# Patient Record
Sex: Female | Born: 1985 | Race: White | Hispanic: No | Smoking: Never smoker
Health system: Southern US, Community
[De-identification: ages and names within clinical notes are randomized; demographics above are authoritative.]

## PROBLEM LIST (undated history)

## (undated) DIAGNOSIS — F32A Depression, unspecified: Secondary | ICD-10-CM

## (undated) DIAGNOSIS — Q7120 Congenital absence of both forearm and hand, unspecified upper limb: Secondary | ICD-10-CM

## (undated) DIAGNOSIS — F329 Major depressive disorder, single episode, unspecified: Secondary | ICD-10-CM

## (undated) DIAGNOSIS — R42 Dizziness and giddiness: Secondary | ICD-10-CM

---

## 2008-07-31 ENCOUNTER — Ambulatory Visit (HOSPITAL_COMMUNITY): Admission: RE | Admit: 2008-07-31 | Discharge: 2008-07-31 | Payer: Self-pay | Admitting: Obstetrics and Gynecology

## 2008-07-31 ENCOUNTER — Encounter (HOSPITAL_COMMUNITY): Payer: Self-pay | Admitting: Obstetrics and Gynecology

## 2010-02-02 ENCOUNTER — Emergency Department: Payer: Self-pay | Admitting: Emergency Medicine

## 2011-04-25 NOTE — Op Note (Signed)
Mary Andersen, SHAHEEN                ACCOUNT NO.:  [id number]   MEDICAL RECORD NO.:  [id number]          PATIENT TYPE:  AMB   LOCATION:  SDC                           FACILITY:  [facility]   PHYSICIAN:  Zelphia Cairo, MD    DATE OF BIRTH:  [birth year]   DATE OF PROCEDURE:  07/31/2008  DATE OF DISCHARGE:                               OPERATIVE REPORT   PREOPERATIVE DIAGNOSIS:  Vertical vaginal septum.   POSTOPERATIVE DIAGNOSIS:  Vertical vaginal septum.   PROCEDURES:  1. Resection of vertical vaginal septum.  2. Examination under anesthesia.  3. Pap smear.   SURGEON:  Zelphia Cairo, MD   ESTIMATED BLOOD LOSS:  Minimal.   COMPLICATIONS:  None.   CONDITION:  Stable to recovery room.   ANESTHESIA:  MAC.   PROCEDURE:  Vasilisa was taken to the operating room where she was placed  in the dorsal lithotomy position using Allen stirrups.  She was prepped  and draped in sterile fashion and a catheter was used to drain her  bladder sterilely for 30 mL of clear urine.  On exam, the vertical  vaginal septum was noted and grasped with an Allis clamp.  A sterile Q-  tip was then placed behind the septum to provide tension.  A Vicryl  suture was used to ligate the superior and inferior portion of the  vaginal septum and the septum was resected using Metzenbaum scissors.  A  speculum exam was then performed using a Pederson speculum.  Cervix  appeared normal and Pap smear was done.  During speculum exam, the hymen  did split somewhat and there was some mild oozing and interrupted  sutures of 3-0 Vicryl were placed to achieve hemostasis.  Alix  tolerated the procedure well.  She was taken to the recovery room in  stable condition.  Sponge lap, needle, and instrument counts were  correct x2.      Zelphia Cairo, MD  Electronically Signed     GA/MEDQ  D:  07/31/2008  T:  08/01/2008  Job:  [id number]

## 2012-04-29 ENCOUNTER — Ambulatory Visit (INDEPENDENT_AMBULATORY_CARE_PROVIDER_SITE_OTHER): Payer: BC Managed Care – PPO | Admitting: Internal Medicine

## 2012-04-29 VITALS — BP 126/80 | HR 74 | Temp 98.4°F | Resp 16 | Ht 61.0 in | Wt 131.2 lb

## 2012-04-29 DIAGNOSIS — Q7120 Congenital absence of both forearm and hand, unspecified upper limb: Secondary | ICD-10-CM | POA: Insufficient documentation

## 2012-04-29 DIAGNOSIS — Z0000 Encounter for general adult medical examination without abnormal findings: Secondary | ICD-10-CM

## 2012-04-29 DIAGNOSIS — R42 Dizziness and giddiness: Secondary | ICD-10-CM

## 2012-04-29 DIAGNOSIS — Q71899 Other reduction defects of unspecified upper limb: Secondary | ICD-10-CM

## 2012-04-29 DIAGNOSIS — R1110 Vomiting, unspecified: Secondary | ICD-10-CM

## 2012-04-29 LAB — POCT UA - MICROSCOPIC ONLY
Casts, Ur, LPF, POC: NEGATIVE
Crystals, Ur, HPF, POC: NEGATIVE
Yeast, UA: NEGATIVE

## 2012-04-29 LAB — POCT URINALYSIS DIPSTICK
Bilirubin, UA: NEGATIVE
Glucose, UA: NEGATIVE
Ketones, UA: NEGATIVE
Leukocytes, UA: NEGATIVE
Nitrite, UA: NEGATIVE
Spec Grav, UA: 1.025
Urobilinogen, UA: 0.2
pH, UA: 7

## 2012-04-29 LAB — POCT CBC
Granulocyte percent: 57.5 %G (ref 37–80)
HCT, POC: 35.4 % — AB (ref 37.7–47.9)
Hemoglobin: 11.4 g/dL — AB (ref 12.2–16.2)
Lymph, poc: 1.7 (ref 0.6–3.4)
MCH, POC: 28.4 pg (ref 27–31.2)
MCHC: 32.2 g/dL (ref 31.8–35.4)
MCV: 88.3 fL (ref 80–97)
MID (cbc): 0.4 (ref 0–0.9)
MPV: 8.8 fL (ref 0–99.8)
POC Granulocyte: 2.8 (ref 2–6.9)
POC LYMPH PERCENT: 35.2 %L (ref 10–50)
POC MID %: 7.3 %M (ref 0–12)
Platelet Count, POC: 217 10*3/uL (ref 142–424)
RBC: 4.01 M/uL — AB (ref 4.04–5.48)
RDW, POC: 12.3 %
WBC: 4.8 10*3/uL (ref 4.6–10.2)

## 2012-04-29 MED ORDER — ONDANSETRON 4 MG PO TBDP: 4.0000 mg | ORAL_TABLET | Freq: Three times a day (TID) | ORAL | Status: AC | PRN

## 2012-04-29 NOTE — Progress Notes (Signed)
Subjective:    Patient ID: Mary Andersen, female    DOB: [birth year], 26 y.o.   MRN: [id number]  HPI Here for physical exam. Had another episode of vertigo last week but does not have it now. Unable to take the bonine because of nausea and vomiting.she has her gyn exam pap smear breast exam done yearly by her gyn. She is up to date on her immunizations.   Review of Systems  Constitutional: Negative.   HENT: Negative.   Eyes: Negative.   Respiratory: Negative.   Cardiovascular: Negative.   Gastrointestinal: Negative.   Genitourinary: Negative.   Musculoskeletal: Negative.   Skin: Negative.   Neurological: Positive for dizziness.  Hematological: Negative.   Psychiatric/Behavioral: Negative.   All other systems reviewed and are negative.       Objective:   Physical Exam  Nursing note and vitals reviewed. Constitutional: She is oriented to person, place, and time. She appears well-developed and well-nourished.  HENT:  Head: Normocephalic and atraumatic.  Right Ear: External ear normal.  Left Ear: External ear normal.  Nose: Nose normal.  Mouth/Throat: Oropharynx is clear and moist.  Eyes: Conjunctivae and EOM are normal. Pupils are equal, round, and reactive to light.  Neck: Normal range of motion. Neck supple. No thyromegaly present.  Cardiovascular: Normal rate, regular rhythm, normal heart sounds and intact distal pulses.   Pulmonary/Chest: Effort normal and breath sounds normal.  Abdominal: Soft. Bowel sounds are normal.  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 She has normal reflexes.  Skin: Skin is warm and dry.  Psychiatric: She has a normal mood and affect. Her behavior is normal. Judgment and thought content normal.     Results for orders placed in visit on 04/29/12  POCT UA - MICROSCOPIC ONLY      Component Value Range   WBC, Ur, HPF, POC 2-4       RBC, urine, microscopic 30-40     Bacteria, U Microscopic 2+     Mucus, UA small     Epithelial cells, urine per micros 2-4     Crystals, Ur, HPF, POC neg     Casts, Ur, LPF, POC neg     Yeast, UA neg    POCT URINALYSIS DIPSTICK      Component Value Range   Color, UA yellow     Clarity, UA cloudy     Glucose, UA neg     Bilirubin, UA neg     Ketones, UA neg     Spec Grav, UA 1.025     Blood, UA large     pH, UA 7.0     Protein, UA trace     Urobilinogen, UA 0.2     Nitrite, UA neg     Leukocytes, UA Negative    POCT CBC      Component Value Range   WBC 4.8  4.6 - 10.2 (K/uL)   Lymph, poc 1.7  0.6 - 3.4    POC LYMPH PERCENT 35.2  10 - 50 (%L)   MID (cbc) 0.4  0 - 0.9    POC MID % 7.3  0 - 12 (%M)   POC Granulocyte 2.8  2 - 6.9    Granulocyte percent 57.5  37 - 80 (%G)   RBC 4.01 (*) 4.04 - 5.48 (M/uL)   Hemoglobin 11.4 (*) 12.2 - 16.2 (  g/dL)   HCT, POC 11.9 (*) 14.7 - 47.9 (%)   MCV 88.3  80 - 97 (fL)   MCH, POC 28.4  27 - 31.2 (pg)   MCHC 32.2  31.8 - 35.4 (g/dL)   RDW, POC 82.9     Platelet Count, POC 217  142 - 424 (K/uL)   MPV 8.8  0 - 99.8 (fL)       Assessment & Plan:  Annual exam amd eval for vertigo with antiemetic rx Mild anemia.Menstruating at present.  Will recommend mvi with iron.

## 2012-04-29 NOTE — Patient Instructions (Signed)
Take a multivitamin with iron daily. The rest of your tests will be back in one week and we will contact you with the results.

## 2012-04-30 LAB — VITAMIN D 25 HYDROXY (VIT D DEFICIENCY, FRACTURES): Vit D, 25-Hydroxy: 39 ng/mL (ref 30–89)

## 2012-04-30 LAB — COMPREHENSIVE METABOLIC PANEL
ALT: 14 U/L (ref 0–35)
AST: 19 U/L (ref 0–37)
Albumin: 4.5 g/dL (ref 3.5–5.2)
Alkaline Phosphatase: 49 U/L (ref 39–117)
BUN: 14 mg/dL (ref 6–23)
CO2: 27 mEq/L (ref 19–32)
Calcium: 9.6 mg/dL (ref 8.4–10.5)
Chloride: 105 mEq/L (ref 96–112)
Creat: 0.57 mg/dL (ref 0.50–1.10)
Glucose, Bld: 86 mg/dL (ref 70–99)
Potassium: 3.9 mEq/L (ref 3.5–5.3)
Sodium: 135 mEq/L (ref 135–145)
Total Bilirubin: 0.6 mg/dL (ref 0.3–1.2)
Total Protein: 7.2 g/dL (ref 6.0–8.3)

## 2012-04-30 LAB — TSH: TSH: 1.579 u[IU]/mL (ref 0.350–4.500)

## 2012-04-30 LAB — LIPID PANEL
Cholesterol: 170 mg/dL (ref 0–200)
HDL: 49 mg/dL (ref >39)
LDL Cholesterol: 110 mg/dL — ABNORMAL HIGH (ref 0–99)
Total CHOL/HDL Ratio: 3.5 Ratio
Triglycerides: 53 mg/dL (ref <150)
VLDL: 11 mg/dL (ref 0–40)

## 2012-08-15 ENCOUNTER — Ambulatory Visit: Payer: Self-pay | Admitting: Otolaryngology

## 2012-08-15 LAB — CBC WITH DIFFERENTIAL/PLATELET
Basophil #: 0 10*3/uL (ref 0.0–0.1)
Basophil %: 0.2 %
Eosinophil #: 0 10*3/uL (ref 0.0–0.7)
Eosinophil %: 0 %
HCT: 40.2 % (ref 35.0–47.0)
HGB: 13.4 g/dL (ref 12.0–16.0)
Lymphocyte #: 0.6 10*3/uL — ABNORMAL LOW (ref 1.0–3.6)
Lymphocyte %: 4.7 %
MCH: 29.5 pg (ref 26.0–34.0)
MCHC: 33.3 g/dL (ref 32.0–36.0)
MCV: 89 fL (ref 80–100)
Monocyte #: 0.4 x10 3/mm (ref 0.2–0.9)
Monocyte %: 3 %
Neutrophil #: 11.3 10*3/uL — ABNORMAL HIGH (ref 1.4–6.5)
Neutrophil %: 92.1 %
Platelet: 242 10*3/uL (ref 150–440)
RBC: 4.53 10*6/uL (ref 3.80–5.20)
RDW: 12 % (ref 11.5–14.5)
WBC: 12.3 10*3/uL — ABNORMAL HIGH (ref 3.6–11.0)

## 2012-08-15 LAB — MONONUCLEOSIS SCREEN: Mono Test: POSITIVE

## 2013-02-25 ENCOUNTER — Ambulatory Visit: Payer: Self-pay | Admitting: Family Medicine

## 2013-05-16 ENCOUNTER — Ambulatory Visit: Payer: Self-pay | Admitting: Family Medicine

## 2013-05-19 ENCOUNTER — Ambulatory Visit: Payer: Self-pay | Admitting: Family Medicine

## 2013-05-27 ENCOUNTER — Ambulatory Visit (INDEPENDENT_AMBULATORY_CARE_PROVIDER_SITE_OTHER): Payer: BC Managed Care – PPO | Admitting: Family Medicine

## 2013-05-27 ENCOUNTER — Encounter: Payer: Self-pay | Admitting: Family Medicine

## 2013-05-27 VITALS — BP 120/66 | HR 72 | Temp 98.1°F | Ht 60.75 in | Wt 140.0 lb

## 2013-05-27 DIAGNOSIS — Z0000 Encounter for general adult medical examination without abnormal findings: Secondary | ICD-10-CM

## 2013-05-27 DIAGNOSIS — Z136 Encounter for screening for cardiovascular disorders: Secondary | ICD-10-CM

## 2013-05-27 LAB — COMPREHENSIVE METABOLIC PANEL
ALT: 16 U/L (ref 0–35)
AST: 17 U/L (ref 0–37)
Albumin: 3.8 g/dL (ref 3.5–5.2)
Alkaline Phosphatase: 49 U/L (ref 39–117)
BUN: 8 mg/dL (ref 6–23)
CO2: 23 mEq/L (ref 19–32)
Calcium: 9.9 mg/dL (ref 8.4–10.5)
Chloride: 106 mEq/L (ref 96–112)
Creatinine, Ser: 0.5 mg/dL (ref 0.4–1.2)
GFR: 152.44 mL/min (ref >60.00)
Glucose, Bld: 98 mg/dL (ref 70–99)
Potassium: 3.5 mEq/L (ref 3.5–5.1)
Sodium: 140 mEq/L (ref 135–145)
Total Bilirubin: 0.5 mg/dL (ref 0.3–1.2)
Total Protein: 7.4 g/dL (ref 6.0–8.3)

## 2013-05-27 LAB — CBC WITH DIFFERENTIAL/PLATELET
Basophils Absolute: 0 10*3/uL (ref 0.0–0.1)
Basophils Relative: 0.4 % (ref 0.0–3.0)
Eosinophils Absolute: 0 10*3/uL (ref 0.0–0.7)
Eosinophils Relative: 0.6 % (ref 0.0–5.0)
HCT: 36.3 % (ref 36.0–46.0)
Hemoglobin: 12.4 g/dL (ref 12.0–15.0)
Lymphocytes Relative: 21.3 % (ref 12.0–46.0)
Lymphs Abs: 1.6 10*3/uL (ref 0.7–4.0)
MCHC: 34.1 g/dL (ref 30.0–36.0)
MCV: 88.3 fl (ref 78.0–100.0)
Monocytes Absolute: 0.6 10*3/uL (ref 0.1–1.0)
Monocytes Relative: 8.5 % (ref 3.0–12.0)
Neutro Abs: 5.2 10*3/uL (ref 1.4–7.7)
Neutrophils Relative %: 69.2 % (ref 43.0–77.0)
Platelets: 239 10*3/uL (ref 150.0–400.0)
RBC: 4.11 Mil/uL (ref 3.87–5.11)
RDW: 12.4 % (ref 11.5–14.6)
WBC: 7.5 10*3/uL (ref 4.5–10.5)

## 2013-05-27 LAB — LIPID PANEL
Cholesterol: 195 mg/dL (ref 0–200)
HDL: 55 mg/dL (ref >39.00)
LDL Cholesterol: 125 mg/dL — ABNORMAL HIGH (ref 0–99)
Total CHOL/HDL Ratio: 4
Triglycerides: 75 mg/dL (ref 0.0–149.0)
VLDL: 15 mg/dL (ref 0.0–40.0)

## 2013-05-27 NOTE — Progress Notes (Signed)
  Subjective:    Patient ID: Mary Andersen, female    DOB: [birth year], 27 y.o.   MRN: [id number]  HPI 27 yo female who is [redacted] weeks pregnant here to establish care without complaints.   She has been feeling very well.  Established with Dr. Renaldo Fiddler, OBGYN.  Taking PNV daily.  Has had some fatigue and constipation, otherwise feels great.  She is adopted, unsure of family history.  Patient Active Problem List   Diagnosis Date Noted  . Routine general medical examination at a health care facility 05/27/2013   No past medical history on file. No past surgical history on file. History  Substance Use Topics  . Smoking status: Never Smoker   . Smokeless tobacco: Not on file  . Alcohol Use: Not on file   Family History  Problem Relation Age of Onset  . Adopted: Yes   No Known Allergies No current outpatient prescriptions on file prior to visit.   No current facility-administered medications on file prior to visit.   The PMH, PSH, Social History, Family History, Medications, and allergies have been reviewed in [facility], and have been updated if relevant.    Review of Systems See HPI    Objective:   Physical Exam BP 120/66  Pulse 72  Temp(Src) 98.1 F (36.7 C)  Ht 5' 0.75" (1.543 m)  Wt 140 lb (63.504 kg)  BMI 26.67 kg/m2  General:  Well-developed,well-nourished,in no acute distress; alert,appropriate and cooperative throughout examination Head:  normocephalic and atraumatic.   Eyes:  vision grossly intact, pupils equal, pupils round, and pupils reactive to light.   Ears:  R ear normal and L ear normal.   Nose:  no external deformity.   Mouth:  good dentition.   Neck:  No deformities, masses, or tenderness noted. Breasts:  No mass, nodules, thickening, tenderness, bulging, retraction, inflamation, nipple discharge or skin changes noted.   Lungs:  Normal respiratory effort, chest expands symmetrically. Lungs are clear to auscultation, no crackles or wheezes. Heart:  Normal  rate and regular rhythm. S1 and S2 normal without gallop, murmur, click, rub or other extra sounds. Abdomen:  Bowel sounds positive,abdomen soft and non-tender without masses, organomegaly or hernias noted. Msk:  No deformity or scoliosis noted of thoracic or lumbar spine.   Extremities:   Left arm congential defect Neurologic:  alert & oriented X3 and gait normal.   Skin:  Intact without suspicious lesions or rashes Cervical Nodes:  No lymphadenopathy noted Axillary Nodes:  No palpable lymphadenopathy Psych:  Cognition and judgment appear intact. Alert and cooperative with normal attention span and concentration. No apparent delusions, illusions, hallucinations    Assessment & Plan:  1. Routine general medical examination at a health care facility Reviewed preventive care protocols, scheduled due services, and updated immunizations Discussed nutrition, exercise, diet, and healthy lifestyle.  - Comprehensive metabolic panel - CBC with Differential  2. Screening for ischemic heart disease  - Lipid Panel

## 2013-05-27 NOTE — Patient Instructions (Addendum)
It was so nice to meet you. We will call you with your lab results.  Good luck with your pregnancy!

## 2013-05-28 ENCOUNTER — Encounter: Payer: Self-pay | Admitting: *Deleted

## 2013-05-29 LAB — OB RESULTS CONSOLE GC/CHLAMYDIA
Chlamydia: NEGATIVE
Gonorrhea: NEGATIVE

## 2013-05-29 LAB — OB RESULTS CONSOLE ABO/RH: RH Type: POSITIVE

## 2013-05-29 LAB — OB RESULTS CONSOLE HEPATITIS B SURFACE ANTIGEN: Hepatitis B Surface Ag: NEGATIVE

## 2013-05-29 LAB — OB RESULTS CONSOLE ANTIBODY SCREEN: Antibody Screen: NEGATIVE

## 2013-05-29 LAB — OB RESULTS CONSOLE RUBELLA ANTIBODY, IGM: Rubella: IMMUNE

## 2013-05-29 LAB — OB RESULTS CONSOLE RPR: RPR: NONREACTIVE

## 2013-05-29 LAB — OB RESULTS CONSOLE HIV ANTIBODY (ROUTINE TESTING): HIV: NONREACTIVE

## 2013-11-27 ENCOUNTER — Encounter (HOSPITAL_COMMUNITY): Payer: Self-pay | Admitting: Emergency Medicine

## 2013-11-27 ENCOUNTER — Emergency Department (HOSPITAL_COMMUNITY)
Admission: EM | Admit: 2013-11-27 | Discharge: 2013-11-27 | Disposition: A | Discharge status: Home / Self Care | Payer: BC Managed Care – PPO | Attending: Emergency Medicine | Admitting: Emergency Medicine

## 2013-11-27 DIAGNOSIS — R51 Headache: Secondary | ICD-10-CM | POA: Insufficient documentation

## 2013-11-27 DIAGNOSIS — R519 Headache, unspecified: Secondary | ICD-10-CM

## 2013-11-27 DIAGNOSIS — R82998 Other abnormal findings in urine: Secondary | ICD-10-CM | POA: Insufficient documentation

## 2013-11-27 DIAGNOSIS — Z8669 Personal history of other diseases of the nervous system and sense organs: Secondary | ICD-10-CM | POA: Insufficient documentation

## 2013-11-27 DIAGNOSIS — O9989 Other specified diseases and conditions complicating pregnancy, childbirth and the puerperium: Secondary | ICD-10-CM | POA: Insufficient documentation

## 2013-11-27 DIAGNOSIS — R109 Unspecified abdominal pain: Secondary | ICD-10-CM | POA: Insufficient documentation

## 2013-11-27 DIAGNOSIS — Z79899 Other long term (current) drug therapy: Secondary | ICD-10-CM | POA: Insufficient documentation

## 2013-11-27 DIAGNOSIS — R5381 Other malaise: Secondary | ICD-10-CM | POA: Insufficient documentation

## 2013-11-27 DIAGNOSIS — Q71899 Other reduction defects of unspecified upper limb: Secondary | ICD-10-CM | POA: Insufficient documentation

## 2013-11-27 DIAGNOSIS — N390 Urinary tract infection, site not specified: Secondary | ICD-10-CM

## 2013-11-27 HISTORY — DX: Dizziness and giddiness: R42

## 2013-11-27 HISTORY — DX: Congenital absence of both forearm and hand, unspecified upper limb: Q71.20

## 2013-11-27 LAB — COMPREHENSIVE METABOLIC PANEL
ALT: 11 U/L (ref 0–35)
AST: 17 U/L (ref 0–37)
Albumin: 3.1 g/dL — ABNORMAL LOW (ref 3.5–5.2)
Alkaline Phosphatase: 92 U/L (ref 39–117)
BUN: 9 mg/dL (ref 6–23)
CO2: 21 mEq/L (ref 19–32)
Calcium: 8.7 mg/dL (ref 8.4–10.5)
Chloride: 105 mEq/L (ref 96–112)
Creatinine, Ser: 0.4 mg/dL — ABNORMAL LOW (ref 0.50–1.10)
GFR calc Af Amer: >90 mL/min (ref >90)
GFR calc non Af Amer: >90 mL/min (ref >90)
Glucose, Bld: 78 mg/dL (ref 70–99)
Potassium: 3.6 mEq/L (ref 3.5–5.1)
Sodium: 137 mEq/L (ref 135–145)
Total Bilirubin: 0.2 mg/dL — ABNORMAL LOW (ref 0.3–1.2)
Total Protein: 6.9 g/dL (ref 6.0–8.3)

## 2013-11-27 LAB — CBC
HCT: 33.2 % — ABNORMAL LOW (ref 36.0–46.0)
Hemoglobin: 11.8 g/dL — ABNORMAL LOW (ref 12.0–15.0)
MCH: 30.2 pg (ref 26.0–34.0)
MCHC: 35.5 g/dL (ref 30.0–36.0)
MCV: 84.9 fL (ref 78.0–100.0)
Platelets: 198 10*3/uL (ref 150–400)
RBC: 3.91 MIL/uL (ref 3.87–5.11)
RDW: 13 % (ref 11.5–15.5)
WBC: 9.4 10*3/uL (ref 4.0–10.5)

## 2013-11-27 LAB — URINALYSIS, ROUTINE W REFLEX MICROSCOPIC
Bilirubin Urine: NEGATIVE
Glucose, UA: NEGATIVE mg/dL
Hgb urine dipstick: NEGATIVE
Ketones, ur: NEGATIVE mg/dL
Nitrite: NEGATIVE
Protein, ur: NEGATIVE mg/dL
Specific Gravity, Urine: 1.019 (ref 1.005–1.030)
Urobilinogen, UA: 1 mg/dL (ref 0.0–1.0)
pH: 7 (ref 5.0–8.0)

## 2013-11-27 LAB — URINE MICROSCOPIC-ADD ON

## 2013-11-27 MED ORDER — NITROFURANTOIN MONOHYD MACRO 100 MG PO CAPS: 100.0000 mg | ORAL_CAPSULE | Freq: Two times a day (BID) | ORAL | Status: DC

## 2013-11-27 NOTE — ED Notes (Signed)
Rapid OB nurse called 

## 2013-11-27 NOTE — Progress Notes (Signed)
[facility] ED called regarding pt at 34 weeks for headache, blurry vision, and weakness. OB RR RN in route

## 2013-11-27 NOTE — ED Notes (Signed)
Dr. Walton at the bedside  

## 2013-11-27 NOTE — Consult Note (Signed)
NEURO HOSPITALIST CONSULT NOTE    Reason for Consult: HA, visual disturbances, legs heaviness, confusion.  HPI:                                                                                                                                          Mary Andersen is an 27 y.o. female with a past medical history significant for episodic vertigo, [redacted] weeks pregnant, sent over to the ED by her OB ( Dr. Renaldo Fiddler) for further evaluation of the above stated symptoms. She said that this is her first pregnancy and it has been uncomplicated so far. Mary Andersen indicated that for the past 3 days she has been experiencing daily, recurrent, intermittent episodes of blurred vision with " starts and flashing silver and brown lights that last for 30-60 minutes and then fade away", associated with a mild, pounding frontal and back of the left head pain without nausea, vomiting, photophobia, or phonophobia. She reports that she is " kind of confused and having trouble putting my thoughts together throughout the episodes". In addition, her legs feel heavy but there is not weakness of her arms, imbalance, or language impairment. According to patient, the episodes are something entirely new to her as she never gets HA.  Denies recent head/neck trauma, fever, infection, or skin rash. Of importance, she and her mother report significant stress in life due to recent death in the family.  Past Medical History  Diagnosis Date  . Vertigo   . 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 No pertinent past surgical history.    Social History:  reports that she has never smoked. She does not have any smokeless tobacco history on file. She reports that she does not drink alcohol or use illicit drugs.  No Known Allergies  MEDICATIONS:                                                                                                                     I have reviewed the  patient's current medications.   ROS:  History obtained from the patient and chart review.  General ROS: negative for - chills, fatigue, fever, night sweats, weight gain or weight loss Psychological ROS: negative for - behavioral disorder, hallucinations, memory difficulties, mood swings or suicidal ideation Ophthalmic ROS: negative for - blurry vision, double vision, eye pain or loss of vision ENT ROS: negative for - epistaxis, nasal discharge, oral lesions, sore throat, tinnitus or vertigo Allergy and Immunology ROS: negative for - hives or itchy/watery eyes Hematological and Lymphatic ROS: negative for - bleeding problems, bruising or swollen lymph nodes Endocrine ROS: negative for - galactorrhea, hair pattern changes, polydipsia/polyuria or temperature intolerance Respiratory ROS: negative for - cough, hemoptysis, shortness of breath or wheezing Cardiovascular ROS: negative for - chest pain, dyspnea on exertion, edema or irregular heartbeat Gastrointestinal ROS: negative for - abdominal pain, diarrhea, hematemesis, nausea/vomiting or stool incontinence Genito-Urinary ROS: negative for - dysuria, hematuria, incontinence or urinary frequency/urgency Musculoskeletal ROS: negative for - joint swelling or muscular weakness Neurological ROS: as noted in HPI Dermatological ROS: negative for rash and skin lesion changes   Physical exam: pleasant female in no apparent distress. Blood pressure 106/58, pulse 66, resp. rate 20, SpO2 100.00%. Head: normocephalic. Neck: supple, no bruits, no JVD. Cardiac: no murmurs. Lungs: clear. Abdomen: soft, no tender, no mass. Extremities: no edema. CV: pulses palpable throughout  Neurologic Examination:                                                                                                      Mental Status: Alert,  oriented, thought content appropriate.  Speech fluent without evidence of aphasia.  Able to follow 3 step commands without difficulty. Cranial Nerves: II: Discs flat bilaterally; Visual fields grossly normal, pupils equal, round, reactive to light and accommodation III,IV, VI: ptosis not present, extra-ocular motions intact bilaterally V,VII: smile symmetric, facial light touch sensation normal bilaterally VIII: hearing normal bilaterally IX,X: gag reflex present XI: bilateral shoulder shrug XII: midline tongue extension without atrophy or fasciculations  Motor: Right : Upper extremity   5/5    Left:     Upper extremity   5/5  Lower extremity   5/5     Lower extremity   5/5 Tone and bulk:normal tone throughout; no atrophy noted Sensory: Pinprick and light touch intact throughout, bilaterally Deep Tendon Reflexes:  Right: Upper Extremity   Left: Upper extremity   biceps (C-5 to C-6) 2/4   biceps (C-5 to C-6) 2/4 tricep (C7) 2/4    triceps (C7) 2/4 Brachioradialis (C6) 2/4  Brachioradialis (C6) 2/4  Lower Extremity Lower Extremity  quadriceps (L-2 to L-4) 2/4   quadriceps (L-2 to L-4) 2/4 Achilles (S1) 2/4   Achilles (S1) 2/4  Plantars: Right: downgoing   Left: downgoing Cerebellar: normal finger-to-nose and  normal heel-to-shin test. Gait:  No tested.    Lab Results  Component Value Date/Time   CHOL 195 05/27/2013 11:16 AM    Results for orders placed during the hospital encounter of 11/27/13 (from the past 48 hour(s))  CBC     Status: Abnormal   Collection Time  11/27/13  5:23 PM      Result Value Range   WBC 9.4  4.0 - 10.5 K/uL   RBC 3.91  3.87 - 5.11 MIL/uL   Hemoglobin 11.8 (*) 12.0 - 15.0 g/dL   HCT 81.1 (*) 91.4 - 78.2 %   MCV 84.9  78.0 - 100.0 fL   MCH 30.2  26.0 - 34.0 pg   MCHC 35.5  30.0 - 36.0 g/dL   RDW 95.6  21.3 - 08.6 %   Platelets 198  150 - 400 K/uL  COMPREHENSIVE METABOLIC PANEL     Status: Abnormal   Collection Time    11/27/13  5:23 PM       Result Value Range   Sodium 137  135 - 145 mEq/L   Potassium 3.6  3.5 - 5.1 mEq/L   Chloride 105  96 - 112 mEq/L   CO2 21  19 - 32 mEq/L   Glucose, Bld 78  70 - 99 mg/dL   BUN 9  6 - 23 mg/dL   Creatinine, Ser 5.78 (*) 0.50 - 1.10 mg/dL   Calcium 8.7  8.4 - 46.9 mg/dL   Total Protein 6.9  6.0 - 8.3 g/dL   Albumin 3.1 (*) 3.5 - 5.2 g/dL   AST 17  0 - 37 U/L   ALT 11  0 - 35 U/L   Alkaline Phosphatase 92  39 - 117 U/L   Total Bilirubin 0.2 (*) 0.3 - 1.2 mg/dL   GFR calc non Af Amer >90  >90 mL/min   GFR calc Af Amer >90  >90 mL/min   Comment: (NOTE)     The eGFR has been calculated using the CKD EPI equation.     This calculation has not been validated in all clinical situations.     eGFR's persistently <90 mL/min signify possible Chronic Kidney     Disease.  URINALYSIS, ROUTINE W REFLEX MICROSCOPIC     Status: Abnormal   Collection Time    11/27/13  6:27 PM      Result Value Range   Color, Urine YELLOW  YELLOW   APPearance CLEAR  CLEAR   Specific Gravity, Urine 1.019  1.005 - 1.030   pH 7.0  5.0 - 8.0   Glucose, UA NEGATIVE  NEGATIVE mg/dL   Hgb urine dipstick NEGATIVE  NEGATIVE   Bilirubin Urine NEGATIVE  NEGATIVE   Ketones, ur NEGATIVE  NEGATIVE mg/dL   Protein, ur NEGATIVE  NEGATIVE mg/dL   Urobilinogen, UA 1.0  0.0 - 1.0 mg/dL   Nitrite NEGATIVE  NEGATIVE   Leukocytes, UA MODERATE (*) NEGATIVE  URINE MICROSCOPIC-ADD ON     Status: Abnormal   Collection Time    11/27/13  6:27 PM      Result Value Range   Squamous Epithelial / LPF FEW (*) RARE   WBC, UA 0-2  <3 WBC/hpf   RBC / HPF 0-2  <3 RBC/hpf   Bacteria, UA FEW (*) RARE   Urine-Other MUCOUS PRESENT      No results found.  Assessment/Plan: 27 y/o who is currently [redacted] weeks pregnant and complains of new onset daily, intermittent mild HA with visual disturbances, legs heaviness, and confusion. She has a non focal neurological examination and stressed the fact that in between those transient episodes she feels  perfectly well. At this moment she is asymptomatic from a neurological standpoint and the pattern of her clinical picture is not quite typical for the usual causes of HA with visual  disturbances during pregnancy: stroke ( ICH, ischemic stroke, or CVT) reversible vasoconstriction syndrome, PRES. Unclear if this could be a first presentation of pregnancy related migraine with aura. She and her family are very concern about radiation exposure during pregnancy and her syndrome is not quite compelling to me to embark on pursuing MRA/MRA/MRV brain at this time. Therefore, we agreed on deferring neuro-imaging at this moment, but she was firmly advised to seek immediate medical attention in the ED in case of worsening of her symptoms or any new neurological development.       Wyatt Portela, MD 11/27/2013, 7:46 PM

## 2013-11-27 NOTE — ED Notes (Signed)
Dr. goldston at the bedside 

## 2013-11-27 NOTE — Progress Notes (Signed)
Dr. Lawrence Marseilles notified of Dr. Vance Gather request for a neurological workup.

## 2013-11-27 NOTE — ED Notes (Addendum)
Per pt sts since a few days ago she has been having HA, floaters, seeing star burst. sts some extreme weakness. Pt is [redacted] weeks pregnant. sts was seen by her OBGYN and was told to come here for neuro workup. Pt also complaining of pelvic pressure

## 2013-11-27 NOTE — Progress Notes (Addendum)
At pt bedside. EFM being placed. Pt is at 24 weeks and was sent over by OB (Dr. Renaldo Fiddler) for headache for 48 hours with blurry and spotting vision off and on. Pt states she has had an increase in "memory loss and extreme weakness." Pt has a history of severe vertigo with headaches and nausea/vomiting. Pt stated that OB sent pt to [facility] ED for neurological workup.

## 2013-11-27 NOTE — ED Provider Notes (Signed)
CSN: [id number]     Arrival date & time 11/27/13  1652 History   First MD Initiated Contact with Patient 11/27/13 1706     Chief Complaint  Patient presents with  . Headache  . Weakness   (Consider location/radiation/quality/duration/timing/severity/associated sxs/prior Treatment) Patient is a 27 y.o. female presenting with headaches.  Headache Pain location:  Generalized Quality:  Dull Severity currently:  0/10 Onset quality:  Gradual Timing:  Intermittent Progression:  Worsening Chronicity:  Recurrent Context: bright light   Relieved by:  Nothing Associated symptoms: abdominal pain (mild abd pain 2/2 pregnancy, nothing new) and visual change   Associated symptoms: no back pain, no cough, no diarrhea, no pain, no fever, no nausea, no numbness, no sore throat and no vomiting    Mary Andersen is a 27 year old female Past Medical History  Diagnosis Date  . Vertigo   . 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 No pertinent past surgical history. Family History  Problem Relation Age of Onset  . Adopted: Yes   History  Substance Use Topics  . Smoking status: Never Smoker   . Smokeless tobacco: Not on file  . Alcohol Use: No   OB History   Grav Para Term Preterm Abortions TAB SAB Ect Mult Living   1              Review of Systems  Constitutional: Negative for fever and chills.  HENT: Negative for sore throat.   Eyes: Negative for pain.  Respiratory: Negative for cough and shortness of breath.   Cardiovascular: Negative for chest pain.  Gastrointestinal: Positive for abdominal pain (mild abd pain 2/2 pregnancy, nothing new). Negative for nausea, vomiting and diarrhea.  Genitourinary: Negative for dysuria.  Musculoskeletal: Negative for back pain.  Skin: Negative for rash.  Neurological: Positive for headaches. Negative for numbness.    Allergies  Review of patient's allergies indicates no known allergies.  Home Medications    Current Outpatient Rx  Name  Route  Sig  Dispense  Refill  . acetaminophen (TYLENOL) 325 MG tablet   Oral   Take 650 mg by mouth daily as needed for mild pain.         . Prenatal Vit-Fe Fumarate-FA (MULTIVITAMIN-PRENATAL) 27-0.8 MG TABS tablet   Oral   Take 1 tablet by mouth daily at 12 noon.         . nitrofurantoin, macrocrystal-monohydrate, (MACROBID) 100 MG capsule   Oral   Take 1 capsule (100 mg total) by mouth 2 (two) times daily. X 7 days   14 capsule   0    BP 119/69  Pulse 70  Resp 16  SpO2 98% Physical Exam  Constitutional: She is oriented to person, place, and time. She appears well-developed and well-nourished. No distress.  HENT:  Head: Normocephalic and atraumatic.  Eyes: Pupils are equal, round, and reactive to light. Right eye exhibits no discharge. Left eye exhibits no discharge.  Neck: Normal range of motion.  Cardiovascular: Normal rate, regular rhythm and normal heart sounds.   Pulmonary/Chest: Effort normal and breath sounds normal.  Abdominal: Soft. She exhibits distension (gravid). There is no tenderness.  Musculoskeletal: Normal range of motion.  Neurological: She is alert and oriented to person, place, and time. She has normal strength. No cranial nerve deficit or sensory deficit. She displays a negative Romberg sign. Coordination and gait normal. GCS eye subscore is 4. GCS verbal subscore is 5. GCS motor subscore is 6.  Skin: Skin is  warm. She is not diaphoretic.    ED Course  Procedures (including critical care time) Labs Review Labs Reviewed  CBC - Abnormal; Notable for the following:    Hemoglobin 11.8 (*)    HCT 33.2 (*)    All other components within normal limits  COMPREHENSIVE METABOLIC PANEL - Abnormal; Notable for the following:    Creatinine, Ser 0.40 (*)    Albumin 3.1 (*)    Total Bilirubin 0.2 (*)    All other components within normal limits  URINALYSIS, ROUTINE W REFLEX MICROSCOPIC - Abnormal; Notable for the following:     Leukocytes, UA MODERATE (*)    All other components within normal limits  URINE MICROSCOPIC-ADD ON - Abnormal; Notable for the following:    Squamous Epithelial / LPF FEW (*)    Bacteria, UA FEW (*)    All other components within normal limits  URINE CULTURE   Imaging Review No results found.  EKG Interpretation   None       MDM   1. Headache   2. Bacteriuria, asymptomatic, in pregnancy, third trimester    27 yo F, pregnant at [redacted] weeks, presents with headaches, visual changes. OB concerned that it is unrelated to preeclampsia, recommended neuro follow-up.   Will consult neuro, follow-up basic labs. Please see neuro consult for their full evaluation/recommendations. Patient with normal neuro exam, no acute findings on exam. Mild bacteriuria, will treat with macrobid. Discussed follow-up with patient, who agrees to continue to monitor symptoms, will follow-up as needed. Discharged to home in stable condition. Strict return precautions given.     Imagene Sheller, MD 11/28/13 0100

## 2013-11-27 NOTE — Progress Notes (Signed)
Dr. Rana Snare called and notified of category I FHR with no contractions. Dr. Rana Snare stressed the importance of the pt needing a full neurological workup.

## 2013-11-27 NOTE — Progress Notes (Signed)
Called [facility] ED to inform them of OB RR RN in route but stuck in heavy traffic.

## 2013-11-28 NOTE — ED Provider Notes (Signed)
I saw and evaluated the patient, reviewed the resident's note and I agree with the findings and plan.  EKG Interpretation   None       Patient completely asymptomatic now. Normal neuro and eye exam. Normal acuity. Highest concern is for preeclampsia, but is asymptomatic, and patient has normal BP and labs now. Discussed with neuro who evaluated (Dr. Cyril Mourning), and he feels she does not need imaging. Patient does not want risk of radiation. As she is normal at this time, I feel she is stable for outpatient w/u and f/u with her OB.  Audree Camel, MD 11/28/13 [phone]

## 2013-11-29 LAB — URINE CULTURE
Colony Count: NO GROWTH
Culture: NO GROWTH

## 2013-12-11 NOTE — L&D Delivery Note (Signed)
Operative Delivery Note At 1:35 PM a viable female was delivered via Vaginal, Vacuum Investment banker, operational(Extractor).  Presentation: vertex; Position: Occiput,, Anterior; Station: +4.  Verbal consent: obtained from patient.  Risks and benefits discussed in detail.  Risks include, but are not limited to the risks of anesthesia, bleeding, infection, damage to maternal tissues, fetal cephalhematoma.  There is also the risk of inability to effect vaginal delivery of the head, or shoulder dystocia that cannot be resolved by established maneuvers, leading to the need for emergency cesarean section.  Shin for over 2 hours. The vertex would come down to the perineum at about +4 station. Fetal heart rate started to have repetitive decelerations with contractions. We decided to proceed with a vacuum extractor assisted delivery. The risk were discussed as noted above. Using the [facility] VAC we're able to bring the head down to the perineum partially crowning. However I could not maintain suction with the mighty vac. I went ahead and made a midline episiotomy. Applied the [employer]. Nubain premie vertex to complete crowning. The Kiwi was removed. The patient and then spontaneously delivered. She had a partial third degree. Muscles intact some the fascia had been torn was reapproximated with 2-0 chromic. Remaining third degree episiotomy was repaired with 2-0 chromic. Rectal exam was unremarkable. Mother and baby were doing well  APGAR: 8, 9; weight .   Placenta status: Intact, Spontaneous.   Cord: 3 vessels with the following complications: .  Cord pH: na  Anesthesia: Epidural  Instruments: mighty vac and kiwi Episiotomy: Median Lacerations: 3rd degree Suture Repair: 2.0 chromic Est. Blood Loss (mL):   Mom to postpartum.  Baby to Couplet care / Skin to Skin.  Noam Franzen S 12/28/2013, 1:50 PM

## 2013-12-16 LAB — OB RESULTS CONSOLE GBS: GBS: NEGATIVE

## 2013-12-28 ENCOUNTER — Encounter (HOSPITAL_COMMUNITY): Payer: Self-pay | Admitting: *Deleted

## 2013-12-28 ENCOUNTER — Inpatient Hospital Stay (HOSPITAL_COMMUNITY)
Admission: AD | Admit: 2013-12-28 | Discharge: 2013-12-30 | DRG: 775 | Disposition: A | Discharge status: Home / Self Care | Payer: BC Managed Care – PPO | Source: Ambulatory Visit | Attending: Obstetrics and Gynecology | Admitting: Obstetrics and Gynecology

## 2013-12-28 ENCOUNTER — Inpatient Hospital Stay (HOSPITAL_COMMUNITY): Payer: BC Managed Care – PPO | Admitting: Anesthesiology

## 2013-12-28 ENCOUNTER — Encounter (HOSPITAL_COMMUNITY): Payer: BC Managed Care – PPO | Admitting: Anesthesiology

## 2013-12-28 DIAGNOSIS — O99892 Other specified diseases and conditions complicating childbirth: Secondary | ICD-10-CM | POA: Diagnosis present

## 2013-12-28 DIAGNOSIS — O9989 Other specified diseases and conditions complicating pregnancy, childbirth and the puerperium: Secondary | ICD-10-CM

## 2013-12-28 DIAGNOSIS — O99344 Other mental disorders complicating childbirth: Secondary | ICD-10-CM | POA: Diagnosis present

## 2013-12-28 DIAGNOSIS — F411 Generalized anxiety disorder: Secondary | ICD-10-CM | POA: Diagnosis present

## 2013-12-28 DIAGNOSIS — O479 False labor, unspecified: Secondary | ICD-10-CM

## 2013-12-28 DIAGNOSIS — IMO0001 Reserved for inherently not codable concepts without codable children: Secondary | ICD-10-CM

## 2013-12-28 DIAGNOSIS — Z8759 Personal history of other complications of pregnancy, childbirth and the puerperium: Secondary | ICD-10-CM

## 2013-12-28 DIAGNOSIS — Q7190 Unspecified reduction defect of unspecified upper limb: Secondary | ICD-10-CM

## 2013-12-28 LAB — COMPREHENSIVE METABOLIC PANEL
ALT: 13 U/L (ref 0–35)
AST: 21 U/L (ref 0–37)
Albumin: 3.1 g/dL — ABNORMAL LOW (ref 3.5–5.2)
Alkaline Phosphatase: 133 U/L — ABNORMAL HIGH (ref 39–117)
BUN: 8 mg/dL (ref 6–23)
CO2: 21 mEq/L (ref 19–32)
Calcium: 9.8 mg/dL (ref 8.4–10.5)
Chloride: 101 mEq/L (ref 96–112)
Creatinine, Ser: 0.46 mg/dL — ABNORMAL LOW (ref 0.50–1.10)
GFR calc Af Amer: >90 mL/min (ref >90)
GFR calc non Af Amer: >90 mL/min (ref >90)
Glucose, Bld: 82 mg/dL (ref 70–99)
Potassium: 4.1 mEq/L (ref 3.7–5.3)
Sodium: 136 mEq/L — ABNORMAL LOW (ref 137–147)
Total Bilirubin: 0.2 mg/dL — ABNORMAL LOW (ref 0.3–1.2)
Total Protein: 6.8 g/dL (ref 6.0–8.3)

## 2013-12-28 LAB — CBC
HCT: 37.6 % (ref 36.0–46.0)
Hemoglobin: 13.2 g/dL (ref 12.0–15.0)
MCH: 29.7 pg (ref 26.0–34.0)
MCHC: 35.1 g/dL (ref 30.0–36.0)
MCV: 84.7 fL (ref 78.0–100.0)
Platelets: 201 10*3/uL (ref 150–400)
RBC: 4.44 MIL/uL (ref 3.87–5.11)
RDW: 13.1 % (ref 11.5–15.5)
WBC: 8.2 10*3/uL (ref 4.0–10.5)

## 2013-12-28 LAB — RPR: RPR Ser Ql: NONREACTIVE

## 2013-12-28 LAB — POCT FERN TEST: POCT Fern Test: POSITIVE

## 2013-12-28 MED ORDER — LANOLIN HYDROUS EX OINT: TOPICAL_OINTMENT | CUTANEOUS | Status: DC | PRN

## 2013-12-28 MED ORDER — LACTATED RINGERS IV SOLN: 500.0000 mL | INTRAVENOUS | Status: DC | PRN

## 2013-12-28 MED ORDER — SIMETHICONE 80 MG PO CHEW: 80.0000 mg | CHEWABLE_TABLET | ORAL | Status: DC | PRN

## 2013-12-28 MED ORDER — PRENATAL MULTIVITAMIN CH
1.0000 | ORAL_TABLET | Freq: Every day | ORAL | Status: DC
  Administered 2013-12-29 – 2013-12-30 (×2): 1 via ORAL
  Filled 2013-12-28 (×2): qty 1

## 2013-12-28 MED ORDER — TETANUS-DIPHTH-ACELL PERTUSSIS 5-2.5-18.5 LF-MCG/0.5 IM SUSP: 0.5000 mL | Freq: Once | INTRAMUSCULAR | Status: DC

## 2013-12-28 MED ORDER — LIDOCAINE HCL (PF) 1 % IJ SOLN
30.0000 mL | INTRAMUSCULAR | Status: DC | PRN
  Filled 2013-12-28 (×2): qty 30

## 2013-12-28 MED ORDER — OXYCODONE-ACETAMINOPHEN 5-325 MG PO TABS
1.0000 | ORAL_TABLET | ORAL | Status: DC | PRN
  Filled 2013-12-28: qty 2

## 2013-12-28 MED ORDER — PHENYLEPHRINE 40 MCG/ML (10ML) SYRINGE FOR IV PUSH (FOR BLOOD PRESSURE SUPPORT)
80.0000 ug | PREFILLED_SYRINGE | INTRAVENOUS | Status: DC | PRN
  Filled 2013-12-28: qty 2

## 2013-12-28 MED ORDER — IBUPROFEN 600 MG PO TABS
600.0000 mg | ORAL_TABLET | Freq: Four times a day (QID) | ORAL | Status: DC
  Administered 2013-12-28 – 2013-12-30 (×8): 600 mg via ORAL
  Filled 2013-12-28 (×8): qty 1

## 2013-12-28 MED ORDER — ZOLPIDEM TARTRATE 5 MG PO TABS: 5.0000 mg | ORAL_TABLET | Freq: Every evening | ORAL | Status: DC | PRN

## 2013-12-28 MED ORDER — FLEET ENEMA 7-19 GM/118ML RE ENEM: 1.0000 | ENEMA | RECTAL | Status: DC | PRN

## 2013-12-28 MED ORDER — FENTANYL 2.5 MCG/ML BUPIVACAINE 1/10 % EPIDURAL INFUSION (WH - ANES)
14.0000 mL/h | INTRAMUSCULAR | Status: DC | PRN
  Administered 2013-12-28: 14 mL/h via EPIDURAL
  Filled 2013-12-28: qty 125

## 2013-12-28 MED ORDER — CITRIC ACID-SODIUM CITRATE 334-500 MG/5ML PO SOLN: 30.0000 mL | ORAL | Status: DC | PRN

## 2013-12-28 MED ORDER — ACETAMINOPHEN 325 MG PO TABS: 650.0000 mg | ORAL_TABLET | ORAL | Status: DC | PRN

## 2013-12-28 MED ORDER — ONDANSETRON HCL 4 MG/2ML IJ SOLN
4.0000 mg | Freq: Four times a day (QID) | INTRAMUSCULAR | Status: DC | PRN
  Administered 2013-12-28: 4 mg via INTRAVENOUS
  Filled 2013-12-28: qty 2

## 2013-12-28 MED ORDER — ONDANSETRON HCL 4 MG PO TABS
4.0000 mg | ORAL_TABLET | ORAL | Status: DC | PRN
Start: 2013-12-28 — End: 2013-12-30

## 2013-12-28 MED ORDER — LACTATED RINGERS IV SOLN
500.0000 mL | Freq: Once | INTRAVENOUS | Status: AC
  Administered 2013-12-28: 500 mL via INTRAVENOUS

## 2013-12-28 MED ORDER — SENNOSIDES-DOCUSATE SODIUM 8.6-50 MG PO TABS
2.0000 | ORAL_TABLET | ORAL | Status: DC
  Administered 2013-12-28: 2 via ORAL
  Administered 2013-12-30: 1 via ORAL
  Administered 2013-12-30: 2 via ORAL
  Filled 2013-12-28: qty 2
  Filled 2013-12-28: qty 1
  Filled 2013-12-28: qty 2

## 2013-12-28 MED ORDER — OXYTOCIN BOLUS FROM INFUSION: 500.0000 mL | INTRAVENOUS | Status: DC

## 2013-12-28 MED ORDER — BUTORPHANOL TARTRATE 1 MG/ML IJ SOLN
1.0000 mg | Freq: Once | INTRAMUSCULAR | Status: AC
  Administered 2013-12-28: 1 mg via INTRAVENOUS
  Filled 2013-12-28: qty 1

## 2013-12-28 MED ORDER — IBUPROFEN 600 MG PO TABS: 600.0000 mg | ORAL_TABLET | Freq: Four times a day (QID) | ORAL | Status: DC | PRN

## 2013-12-28 MED ORDER — DIPHENHYDRAMINE HCL 25 MG PO CAPS: 25.0000 mg | ORAL_CAPSULE | Freq: Four times a day (QID) | ORAL | Status: DC | PRN

## 2013-12-28 MED ORDER — LACTATED RINGERS IV SOLN
INTRAVENOUS | Status: DC
  Administered 2013-12-28 (×2): via INTRAVENOUS

## 2013-12-28 MED ORDER — EPHEDRINE 5 MG/ML INJ
10.0000 mg | INTRAVENOUS | Status: DC | PRN
  Filled 2013-12-28: qty 2

## 2013-12-28 MED ORDER — FLEET ENEMA 7-19 GM/118ML RE ENEM: 1.0000 | ENEMA | Freq: Every day | RECTAL | Status: DC | PRN

## 2013-12-28 MED ORDER — DIPHENHYDRAMINE HCL 50 MG/ML IJ SOLN: 12.5000 mg | INTRAMUSCULAR | Status: DC | PRN

## 2013-12-28 MED ORDER — OXYTOCIN 40 UNITS IN LACTATED RINGERS INFUSION - SIMPLE MED: 1.0000 m[IU]/min | INTRAVENOUS | Status: DC

## 2013-12-28 MED ORDER — OXYTOCIN 40 UNITS IN LACTATED RINGERS INFUSION - SIMPLE MED
62.5000 mL/h | INTRAVENOUS | Status: DC
  Filled 2013-12-28: qty 1000

## 2013-12-28 MED ORDER — WITCH HAZEL-GLYCERIN EX PADS: 1.0000 "application " | MEDICATED_PAD | CUTANEOUS | Status: DC | PRN

## 2013-12-28 MED ORDER — OXYCODONE-ACETAMINOPHEN 5-325 MG PO TABS
1.0000 | ORAL_TABLET | ORAL | Status: DC | PRN
  Administered 2013-12-28: 2 via ORAL
  Administered 2013-12-29 – 2013-12-30 (×4): 1 via ORAL
  Filled 2013-12-28 (×5): qty 1

## 2013-12-28 MED ORDER — TERBUTALINE SULFATE 1 MG/ML IJ SOLN: 0.2500 mg | Freq: Once | INTRAMUSCULAR | Status: DC | PRN

## 2013-12-28 MED ORDER — ONDANSETRON HCL 4 MG/2ML IJ SOLN: 4.0000 mg | INTRAMUSCULAR | Status: DC | PRN

## 2013-12-28 MED ORDER — LIDOCAINE HCL (PF) 1 % IJ SOLN
INTRAMUSCULAR | Status: DC | PRN
Start: 2013-12-28 — End: 2013-12-29
  Administered 2013-12-28 (×2): 5 mL

## 2013-12-28 MED ORDER — EPHEDRINE 5 MG/ML INJ
10.0000 mg | INTRAVENOUS | Status: DC | PRN
  Filled 2013-12-28: qty 4
  Filled 2013-12-28: qty 2

## 2013-12-28 MED ORDER — DIBUCAINE 1 % RE OINT: 1.0000 "application " | TOPICAL_OINTMENT | RECTAL | Status: DC | PRN

## 2013-12-28 MED ORDER — BISACODYL 10 MG RE SUPP: 10.0000 mg | Freq: Every day | RECTAL | Status: DC | PRN

## 2013-12-28 MED ORDER — PHENYLEPHRINE 40 MCG/ML (10ML) SYRINGE FOR IV PUSH (FOR BLOOD PRESSURE SUPPORT)
80.0000 ug | PREFILLED_SYRINGE | INTRAVENOUS | Status: DC | PRN
  Filled 2013-12-28: qty 10
  Filled 2013-12-28: qty 2

## 2013-12-28 MED ORDER — BENZOCAINE-MENTHOL 20-0.5 % EX AERO
1.0000 "application " | INHALATION_SPRAY | CUTANEOUS | Status: DC | PRN
  Filled 2013-12-28: qty 56

## 2013-12-28 NOTE — MAU Provider Note (Signed)
  History     CSN: [id number]  Arrival date and time: 12/28/13 0444   None     Chief Complaint  Patient presents with  . Labor Eval  . Rupture of Membranes   HPI This is a 28 y.o. female at 6423w3d being admitted for labor I was asked to see her re: sudden development of pressure behind right eye and blurred vision. There is a note from Neurology in mid- December with same symptoms.   OB History   Grav Para Term Preterm Abortions TAB SAB Ect Mult Living   1               Past Medical History  Diagnosis Date  . Vertigo   . 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 No pertinent past surgical history.  Family History  Problem Relation Age of Onset  . Adopted: Yes    History  Substance Use Topics  . Smoking status: Never Smoker   . Smokeless tobacco: Not on file  . Alcohol Use: No    Allergies: No Known Allergies  Prescriptions prior to admission  Medication Sig Dispense Refill  . acetaminophen (TYLENOL) 325 MG tablet Take 650 mg by mouth daily as needed for mild pain.      . nitrofurantoin, macrocrystal-monohydrate, (MACROBID) 100 MG capsule Take 1 capsule (100 mg total) by mouth 2 (two) times daily. X 7 days  14 capsule  0  . Prenatal Vit-Fe Fumarate-FA (MULTIVITAMIN-PRENATAL) 27-0.8 MG TABS tablet Take 1 tablet by mouth daily at 12 noon.        Review of Systems  Constitutional: Negative for fever and chills.  Eyes: Positive for blurred vision and pain (pressure, not pain behind left eye).  Gastrointestinal: Positive for abdominal pain.  Neurological: Negative for headaches.   Physical Exam   Blood pressure 137/89, pulse 67, temperature 97.9 F (36.6 C), temperature source Oral, resp. rate 20.  Physical Exam  Constitutional: She is oriented to person, place, and time. She appears well-developed and well-nourished.  HENT:  Head: Normocephalic.  Eyes: Conjunctivae and EOM are normal. Pupils are equal, round, and reactive to  light. Right eye exhibits no discharge. Left eye exhibits no discharge.  Musculoskeletal: She exhibits no edema.  Neurological: She is alert and oriented to person, place, and time. She has normal reflexes. She displays normal reflexes. She exhibits normal muscle tone (no clonus).    MAU Course  Procedures  Assessment and Plan  A:  Pressure behind right eye with blurred vision      Elevated BPs  P:  Discussed with Dr Arelia SneddonMcComb      Will add [facility] labs  [facility] HospitalWILLIAMS,Bolden Hagerman 12/28/2013, 5:54 AM

## 2013-12-28 NOTE — Anesthesia Preprocedure Evaluation (Signed)

## 2013-12-28 NOTE — Lactation Note (Signed)
This note was copied from the chart of Mary Faythe DingwallJessica Lana. Lactation Consultation Note  Patient Name: Mary Andersen ZOXWR'UToday's Date: 12/28/2013 Reason for consult: Initial assessment of this primipara and her newborn at 7 hours of age and STS and asleep after bath.  This mom was born without a left forearm and hand and is aware that breastfeeding may pose some challenges, especially in early days while both mom and baby are learning.  Mom has a boppy pillow which helps her support baby more fully and mom reports that baby has latched more easily on (L).  Initial LATCH score=6 but baby too sleepy to latch at next attempt with RN assistance.  Possibility of using a NS discussed with both mom and RN and mom to call for latch assistance when feeding cues noted.LC encouraged review of Baby and Me pp 9, 14 and 20-25 for STS and BF information. LC provided [facility] [employer] Resource brochure and reviewed [facility] services and list of community and web site resources.      Maternal Data Formula Feeding for Exclusion: No Infant to breast within first hour of birth: Yes Has patient been taught Hand Expression?: Yes (RN documented teaching mom) Does the patient have breastfeeding experience prior to this delivery?: No  Feeding Feeding Type: Breast Fed  LATCH Score/Interventions           initial LATCH score=6           Lactation Tools Discussed/Used   STS, cue feeding, hand expression  Consult Status Consult Status: Follow-up Date: 12/29/13 Follow-up type: In-patient    Warrick ParisianBryant, Latorie Montesano [facility] 12/28/2013, 9:29 PM

## 2013-12-28 NOTE — H&P (Signed)
Mary DingwallJessica Andersen is a 28 y.o. female presenting at 6038.4 with SROM.  Uncomplicated prenatal course. Negative GBS. Maternal Medical History:  Reason for admission: Rupture of membranes.   Contractions: Onset was 1-2 hours ago.   Frequency: regular.   Perceived severity is strong.    Fetal activity: Perceived fetal activity is normal.    Prenatal complications: no prenatal complications Prenatal Complications - Diabetes: none.    OB History   Grav Para Term Preterm Abortions TAB SAB Ect Mult Living   1              Past Medical History  Diagnosis Date  . Vertigo   . 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 No pertinent past surgical history. Family History: family history is not on file. She was adopted. Social History:  reports that she has never smoked. She does not have any smokeless tobacco history on file. She reports that she does not drink alcohol or use illicit drugs.   Prenatal Transfer Tool  Maternal Diabetes: No Genetic Screening: Normal Maternal Ultrasounds/Referrals: Normal Fetal Ultrasounds or other Referrals:  None Maternal Substance Abuse:  No Significant Maternal Medications:  None Significant Maternal Lab Results:  None Other Comments:  None  ROS  Dilation: 3.5 Effacement (%): 90 Station: -2 Exam by:: L. Munford RN Blood pressure 131/80, pulse 70, temperature 97.4 F (36.3 C), temperature source Oral, resp. rate 20, height 5' 0.75" (1.543 m), weight 65.318 kg (144 lb), SpO2 100.00%. Maternal Exam:  Uterine Assessment: Contraction strength is moderate.  Contraction frequency is regular.   Abdomen: Patient reports no abdominal tenderness. Fundal height is c/w dates.   Estimated fetal weight is 7.   Fetal presentation: vertex  Cervix: 8 cm 100 % vtx at zero station  Fetal Exam Fetal State Assessment: Category I - tracings are normal.     Physical Exam  Prenatal labs: ABO, Rh: AB/Positive/-- (06/19 0000) Antibody:  Negative (06/19 0000) Rubella: Immune (06/19 0000) RPR: Nonreactive (06/19 0000)  HBsAg: Negative (06/19 0000)  HIV: Non-reactive (06/19 0000)  GBS: Negative (01/06 0000)   Assessment/Plan: Intrauterine pregnancy at term with SROM Routine labor and delivery   [name] S 12/28/2013, 7:52 AM

## 2013-12-28 NOTE — Anesthesia Procedure Notes (Signed)
Epidural Patient location during procedure: OB Start time: 12/28/2013 6:47 AM  Staffing Anesthesiologist: Brayton CavesJACKSON, Kaleth Koy Performed by: anesthesiologist   Preanesthetic Checklist Completed: patient identified, site marked, surgical consent, pre-op evaluation, timeout performed, IV checked, risks and benefits discussed and monitors and equipment checked  Epidural Patient position: sitting Prep: site prepped and draped and DuraPrep Patient monitoring: continuous pulse ox and blood pressure Approach: midline Injection technique: LOR air  Needle:  Needle type: Tuohy  Needle gauge: 17 G Needle length: 9 cm and 9 Needle insertion depth: 5 cm cm Catheter type: closed end flexible Catheter size: 19 Gauge Catheter at skin depth: 10 cm Test dose: negative  Assessment Events: blood not aspirated, injection not painful, no injection resistance, negative IV test and no paresthesia  Additional Notes Patient identified.  Risk benefits discussed including failed block, incomplete pain control, headache, nerve damage, paralysis, blood pressure changes, nausea, vomiting, reactions to medication both toxic or allergic, and postpartum back pain.  Patient expressed understanding and wished to proceed.  All questions were answered.  Sterile technique used throughout procedure and epidural site dressed with sterile barrier dressing. No paresthesia or other complications noted.The patient did not experience any signs of intravascular injection such as tinnitus or metallic taste in mouth nor signs of intrathecal spread such as rapid motor block. Please see nursing notes for vital signs.

## 2013-12-28 NOTE — MAU Note (Signed)
Patient presents with complaints of contractions every 5 minutes that began at 3am. After arrival patient had a gush of clear fluid at 0445.

## 2013-12-29 LAB — CBC
HCT: 30.5 % — ABNORMAL LOW (ref 36.0–46.0)
Hemoglobin: 10.5 g/dL — ABNORMAL LOW (ref 12.0–15.0)
MCH: 29.7 pg (ref 26.0–34.0)
MCHC: 34.4 g/dL (ref 30.0–36.0)
MCV: 86.2 fL (ref 78.0–100.0)
Platelets: 140 10*3/uL — ABNORMAL LOW (ref 150–400)
RBC: 3.54 MIL/uL — ABNORMAL LOW (ref 3.87–5.11)
RDW: 13 % (ref 11.5–15.5)
WBC: 9.1 10*3/uL (ref 4.0–10.5)

## 2013-12-29 NOTE — Progress Notes (Signed)
Clinical Social Work Department PSYCHOSOCIAL ASSESSMENT - MATERNAL/CHILD 12/29/2013  Patient:  Mary Andersen,Mary Andersen  Account Number:  [id number]  Admit Date:  12/28/2013  Childs Name:   Mary Andersen    Clinical Social Worker:  Kemyra August, LCSW   Date/Time:  12/29/2013 04:15 PM  Date Referred:  12/29/2013   Referral source  CN     Referred reason  Behavioral Health Issues   Other referral source:    I:  FAMILY / HOME ENVIRONMENT Child's legal guardian:  PARENT  Guardian - Name Guardian - Age Guardian - Address  Keyara Westervelt [address] [place], [place] [address]  Donnie Davanzo  same   Other household support members/support persons Name Relationship DOB  Jathon SON 7   Other support:   Parents report having a great support system.  MOB's adoptive mother lives nearby and is supportive.  FOB's family lives nearby and is supportive.  Jathon is FOB's son from a previous relationship.  This is MOB's first child.    II  PSYCHOSOCIAL DATA Information Source:  Family Interview  Financial and Community Resources Employment:   MOB works at [employer] and has 6 weeks off for maternity leave.  FOB works for [employer] in [place].   Financial resources:  Private Insurance If Medicaid - County:    School / Grade:   Maternity Care Coordinator / Child Services Coordination / Early Interventions:  Cultural issues impacting care:   None stated    III  STRENGTHS Strengths  Adequate Resources  Compliance with medical plan  Home prepared for Child (including basic supplies)  Supportive family/friends   Strength comment:    IV  RISK FACTORS AND CURRENT PROBLEMS Current Problem:  YES   Risk Factor & Current Problem Patient Issue Family Issue Risk Factor / Current Problem Comment  Mental Illness Y N MOB-hx of Anx and anger issues   N N     V  SOCIAL WORK ASSESSMENT  CSW met with MOB in her first floor room/126 to complete assessment for hx of  Anxiety.  MGM and FOB were present when CSW arrived and CSW offered to return at a later time, but MGM stated she was just leaving.  CSW feels we had a productive conversation, however, many concerns were stated, mainly by MOB regarding fears of the post partum period.  MOB first told CSW that she has not bonded at all with baby while in utero and that she thought bonding would be immediate at delivery and it was not.  She states she was "fine" with everyone else holding her yesterday and felt nothing about wanting to hold baby herself.  She states she is starting to feel bonded with baby today.  CSW states bonding is different for each new mother and the fact that she is starting to bond with baby is a good sign.  CSW would be concerned if bonding was not happening at all, rather than it not happening immediately.  MOB continued to state concerns with not wanting her routine to be disrupted and how she does not want to be "stuck in the house for 6 weeks."  She is also worried about being alone after FOB goes back to work in a week.  She states she is very particular about how her house is kept and that she will "go crazy" if things are a mess.  CSW first commends MOB for being able to talk about her fears and for being able to identify   possible triggers for anxiety and depression.  CSW considers that a strength, however, is concerned that she has identified many things that will happen with a newborn.  CSW encourages MOB to think about her "new normal" and new routine.  CSW stated that these will not be forever, but that MOB may need to relax on her routine and expectations of how her house is kept for a period of time while she is adjusting to baby and being a new mother.  CSW states it can take a very long time to get baby into a routine, so while they may be able to get to some type of routine at some point, it will be different than their routine pre-baby.  CSW suggests identifying people who can come keep MOB  company after FOB goes back to work and people who may be able to help with housework.  CSW informed MOB that she will need to sleep when baby is sleeping and cleaning the house will not always be possible in the beginning.  She states understanding, but still seems to have somewhat unrealistic expectations of having a newborn.  She states she knows she will be down since she only has one hand and that will make things more challenging for her.  She is already doing a great job managing this.  She was attending to baby and breastfeeding baby while CSW spoke with parents.  Bonding is evident at this time.  CSW discussed signs and symptoms of PPD as well as what is normal to expect after the birth of a baby.  Parents were interested and attentive.  Parents report being in marriage and individual counseling at this time.  MOB states she took Zoloft prior to finding out she was pregnant and "quitting cold turkey" as soon as she found out she was pregnant.  She states her main symptom of anxiety is anger.  CSW recommends that MOB restart an antidepressant and informed MOB that Zoloft is safe to take while breastfeeding.  She seemed surprised and relieved and states she wants to start back on Zoloft.  FOB appeared nervous and she asked him what he thought of this.  He replied, "you know I'm paranoid."  CSW asked him what his feeling of paranoia were in this case.  He states he has read literature that states babies have developed the symptoms mothers were treating when being breast fed by a mother taking an antidepressant.  CSW cautioned him to realize that heredity plays a role.  FOB stated to MOB that medication is not going to "fix" her.  She informed him that he "takes it too."  CSW stopped the conversation and compared a medical dx like Diabetes with a mental dx like Depression.  CSW explained that someone with Diabetes needs to diet and exercise, but may require insulin to be regulated.  CSW stated that a person  with depression needs to process their feelings and learn coping strategies in therapy, but may also require an antidepressant to boost brain chemicals.  They stated understanding.  CSW asked if they would like to speak with a lactation consultant regarding the matter and they do.  CSW also encourages them to talk with MOB's OB, or offered to call OB to make a recommendation that MOB restart medication if they decide this is what they want to do.  We have planned for parents to talk to each other and lactation tonight and then CSW will return in the morning and speak with OB   if they desire.  CSW adds that while an advocate of breast feeding, CSW feels it is most important that MOB be stable mentally.  CSW encouraged MOB and FOB to talk openly and be gentle and supportive of each other.  CSW again pointed out strengths, especially that they are willing to address their fears and make a plan for the best possible outcome.  Parents thanked CSW for the visit and for CSW's concern for their emotional wellbeing.   VI SOCIAL WORK PLAN Social Work Plan  Patient/Family Education  Psychosocial Support/Ongoing Assessment of Needs   Type of pt/family education:   PPD signs and symptoms  benefits of counseling coupled with medication   If child protective services report - county:   If child protective services report - date:   Information/referral to community resources comment:   Other social work plan:   CSW will return in the morning to discuss plan for MOB's mental health stability.    

## 2013-12-29 NOTE — Lactation Note (Signed)
This note was copied from the chart of Girl Faythe DingwallJessica Darnold. Lactation Consultation Note  Patient Name: Girl Faythe DingwallJessica Salsgiver ZOXWR'UToday's Date: 12/29/2013 Reason for consult: Follow-up assessment;Difficult latch Mom called for assistance with latching baby on right breast. Baby was latched to left breast in cradle hold demonstrating a good suckling pattern. Baby had nursed for about 10 minutes, then [facility] assisted Mom to re-latch to right breast in football/sitting position. Baby latched with minimal assist. She demonstrated a good suckling pattern with swallows noted. Mom needed minimal assist in this position on the right breast and reports feeling good about this feeding. Demonstrated to parents how to support baby at the breast with blankets/pillows to free up Mom's right hand.  Advised Mom is she is having trouble getting baby to latch to right breast, start out on left breast and after 5-10 minutes, latch baby to right breast. It is important for Mom to feel independent per Mom's report. Will have LC follow up with Mom before d/c tomorrow.   Maternal Data    Feeding Feeding Type: Breast Fed Length of feed: 10 min  LATCH Score/Interventions Latch: Grasps breast easily, tongue down, lips flanged, rhythmical sucking. Intervention(s): Skin to skin Intervention(s): Adjust position;Assist with latch;Breast massage;Breast compression  Audible Swallowing: A few with stimulation Intervention(s): Skin to skin Intervention(s): Skin to skin  Type of Nipple: Everted at rest and after stimulation  Comfort (Breast/Nipple): Filling, red/small blisters or bruises, mild/mod discomfort  Problem noted: Mild/Moderate discomfort Interventions (Mild/moderate discomfort): Comfort gels (EBM to sore nipples)  Hold (Positioning): Assistance needed to correctly position infant at breast and maintain latch.  LATCH Score: 7  Lactation Tools Discussed/Used Tools: Shells;Comfort gels Shell Type:  Inverted   Consult Status Consult Status: Follow-up Date: 12/29/13 Follow-up type: In-patient    Alfred LevinsGranger, Dallis Darden Ann 12/29/2013, 10:12 PM

## 2013-12-29 NOTE — Anesthesia Postprocedure Evaluation (Signed)
Anesthesia Post Note  Patient: Mary Andersen  Procedure(s) Performed: * No procedures listed *  Anesthesia type: Epidural  Patient location: Mother/Baby  Post pain: Pain level controlled  Post assessment: Post-op Vital signs reviewed  Last Vitals:  Filed Vitals:   12/29/13 0510  BP: 110/69  Pulse: 68  Temp: 36.4 C  Resp: 18    Post vital signs: Reviewed  Level of consciousness: awake  Complications: No apparent anesthesia complications

## 2013-12-29 NOTE — Progress Notes (Signed)
Post Partum Day 1 Subjective: up ad lib, voiding, tolerating PO and c/o perineal pain  Objective: Blood pressure 110/69, pulse 68, temperature 97.5 F (36.4 C), temperature source Oral, resp. rate 18, height 5' 0.75" (1.543 m), weight 144 lb (65.318 kg), SpO2 100.00%, unknown if currently breastfeeding.  Physical Exam:  General: alert and cooperative Lochia: appropriate Uterine Fundus: firm Incision: perineum intact, no ecchymosis noted or significant edema DVT Evaluation: No evidence of DVT seen on physical exam. Negative Homan's sign. No cords or calf tenderness. No significant calf/ankle edema.   Recent Labs  12/28/13 0515 12/29/13 0550  HGB 13.2 10.5*  HCT 37.6 30.5*    Assessment/Plan: Plan for discharge tomorrow  sitz pan   LOS: 1 day   CURTIS,CAROL G 12/29/2013, 8:05 AM

## 2013-12-29 NOTE — Lactation Note (Signed)
This note was copied from the chart of Mary Faythe DingwallJessica Rudge. Lactation Consultation Note  Patient Name: Mary Andersen ZOXWR'UToday's Date: 12/29/2013 Reason for consult: Follow-up assessment Mom had questions about anti-depressant Zoloft, Reviewed with Mom, FOB present, that Zoloft was L1, per [place], compatible with BF. Mom is having difficulty with positioning due to limitations with left arm. Requested help with latching baby to right breast, baby sleepy and would not latch. Mom reports nipple tenderness on left breast, no breakdown noted. Care for sore nipples reviewed, comfort gels given with instructions. Mom is considering pumping the right breast and latching baby to left. Offered to set up DEBP, but Mom advised she would like to try to latch with assist before giving up on right breast. Left LC phone number for Mom to call with next feeding.   Maternal Data    Feeding Feeding Type: Breast Fed Length of feed: 0 min  LATCH Score/Interventions Latch: Too sleepy or reluctant, no latch achieved, no sucking elicited.     Type of Nipple: Everted at rest and after stimulation  Comfort (Breast/Nipple): Filling, red/small blisters or bruises, mild/mod discomfort  Problem noted: Mild/Moderate discomfort Interventions (Mild/moderate discomfort): Comfort gels (EBM to sore nipples)  Hold (Positioning): Assistance needed to correctly position infant at breast and maintain latch.     Lactation Tools Discussed/Used Tools: Comfort gels;Shells Shell Type: Inverted   Consult Status Consult Status: Follow-up Date: 12/29/13 Follow-up type: In-patient    Alfred LevinsGranger, Anjolie Majer Ann 12/29/2013, 8:15 PM

## 2013-12-30 MED ORDER — IBUPROFEN 600 MG PO TABS: 600.0000 mg | ORAL_TABLET | Freq: Four times a day (QID) | ORAL | Status: DC

## 2013-12-30 MED ORDER — SERTRALINE HCL 50 MG PO TABS
50.0000 mg | ORAL_TABLET | Freq: Every day | ORAL | Status: DC
  Administered 2013-12-30: 50 mg via ORAL
  Filled 2013-12-30 (×2): qty 1

## 2013-12-30 MED ORDER — OXYCODONE-ACETAMINOPHEN 5-325 MG PO TABS: 1.0000 | ORAL_TABLET | ORAL | Status: DC | PRN

## 2013-12-30 MED ORDER — SERTRALINE HCL 50 MG PO TABS: 50.0000 mg | ORAL_TABLET | Freq: Every day | ORAL | Status: DC

## 2013-12-30 NOTE — Discharge Summary (Signed)
Obstetric Discharge Summary Reason for Admission: rupture of membranes Prenatal Procedures: ultrasound Intrapartum Procedures: vacuum Postpartum Procedures: none Complications-Operative and Postpartum: 3 degree perineal laceration Hemoglobin  Date Value Range Status  12/29/2013 10.5* 12.0 - 15.0 g/dL Final     REPEATED TO VERIFY     DELTA CHECK NOTED  04/29/2012 11.4* 12.2 - 16.2 g/dL Final     HCT  Date Value Range Status  12/29/2013 30.5* 36.0 - 46.0 % Final     HCT, POC  Date Value Range Status  04/29/2012 35.4* 37.7 - 47.9 % Final    Physical Exam:  General: alert and cooperative. Patient reports h/o anxiety, desires to restart Zoloft Lochia: appropriate Uterine Fundus: firm Incision: perineum intact DVT Evaluation: No evidence of DVT seen on physical exam. Negative Homan's sign. No cords or calf tenderness. No significant calf/ankle edema.  Discharge Diagnoses: Term Pregnancy-delivered  Discharge Information: Date: 12/30/2013 Activity: pelvic rest Diet: routine Medications: PNV, Ibuprofen, Percocet and zoloft Condition: stable Instructions: refer to practice specific booklet Discharge to: home   Newborn Data: Live born female  Birth Weight: 6 lb 4 oz (2835 g) APGAR: 8, 9  Home with mother.  Sandara Tyree G 12/30/2013, 8:33 AM

## 2013-12-30 NOTE — Progress Notes (Signed)
CSW met with Mary Andersen to follow up from our conversation yesterday.  Mary Andersen seemed more relaxed today and thanked CSW for talking things through with her.  She states she has decided to restart Zoloft and feels good about this decision.  She states FOB is just going to have to "deal with it."  She talked more about her stressors and struggles.  CSW encouraged her to keep talking, give them medication time to get into her system and continue in therapy.  CSW also encourages her to identify positive aspects of her situation/having a new baby and try to focus on those.  CSW is concerned about Mary Andersen and her risk of developing PPD, but thinks she will talk with her doctor and counselor as needed.  CSW offered to have her call CSW back as needed also.  She has CSW's contact information.  CSW will make a [facility] referral as well.

## 2014-05-08 ENCOUNTER — Encounter: Payer: Self-pay | Admitting: Internal Medicine

## 2014-05-08 ENCOUNTER — Ambulatory Visit: Payer: BC Managed Care – PPO | Admitting: Internal Medicine

## 2014-05-08 ENCOUNTER — Ambulatory Visit (INDEPENDENT_AMBULATORY_CARE_PROVIDER_SITE_OTHER): Payer: BC Managed Care – PPO | Admitting: Internal Medicine

## 2014-05-08 VITALS — BP 104/68 | HR 63 | Temp 98.2°F | Wt 130.8 lb

## 2014-05-08 DIAGNOSIS — H6692 Otitis media, unspecified, left ear: Secondary | ICD-10-CM

## 2014-05-08 DIAGNOSIS — F32A Depression, unspecified: Secondary | ICD-10-CM

## 2014-05-08 DIAGNOSIS — F3289 Other specified depressive episodes: Secondary | ICD-10-CM

## 2014-05-08 DIAGNOSIS — F329 Major depressive disorder, single episode, unspecified: Secondary | ICD-10-CM

## 2014-05-08 DIAGNOSIS — H6690 Otitis media, unspecified, unspecified ear: Secondary | ICD-10-CM

## 2014-05-08 MED ORDER — CEFDINIR 300 MG PO CAPS: 300.0000 mg | ORAL_CAPSULE | Freq: Two times a day (BID) | ORAL | Status: DC

## 2014-05-08 MED ORDER — SERTRALINE HCL 100 MG PO TABS: 100.0000 mg | ORAL_TABLET | Freq: Every day | ORAL | Status: DC

## 2014-05-08 NOTE — Assessment & Plan Note (Signed)
Worse secondary to life stress Will increase to 100 mg daily  Let us know if you have any further issues or concerns

## 2014-05-08 NOTE — Progress Notes (Signed)
Pre visit review using our clinic review tool, if applicable. No additional management support is needed unless otherwise documented below in the visit note. 

## 2014-05-08 NOTE — Patient Instructions (Addendum)
Otitis Media, Adult Otitis media is redness, soreness, and swelling (inflammation) of the middle ear. Otitis media may be caused by allergies or, most commonly, by infection. Often it occurs as a complication of the common cold. SIGNS AND SYMPTOMS Symptoms of otitis media may include:  Earache.  Fever.  Ringing in your ear.  Headache.  Leakage of fluid from the ear. DIAGNOSIS To diagnose otitis media, your health care provider will examine your ear with an otoscope. This is an instrument that allows your health care provider to see into your ear in order to examine your eardrum. Your health care provider also will ask you questions about your symptoms. TREATMENT  Typically, otitis media resolves on its own within 3 5 days. Your health care provider may prescribe medicine to ease your symptoms of pain. If otitis media does not resolve within 5 days or is recurrent, your health care provider may prescribe antibiotic medicines if he or she suspects that a bacterial infection is the cause. HOME CARE INSTRUCTIONS   Take your medicine as directed until it is gone, even if you feel better after the first few days.  Only take over-the-counter or prescription medicines for pain, discomfort, or fever as directed by your health care provider.  Follow up with your health care provider as directed. SEEK MEDICAL CARE IF:  You have otitis media only in one ear or bleeding from your nose or both.  You notice a lump on your neck.  You are not getting better in 3 5 days.  You feel worse instead of better. SEEK IMMEDIATE MEDICAL CARE IF:   You have pain that is not controlled with medicine.  You have swelling, redness, or pain around your ear or stiffness in your neck.  You notice that part of your face is paralyzed.  You notice that the bone behind your ear (mastoid) is tender when you touch it. MAKE SURE YOU:   Understand these instructions.  Will watch your condition.  Will get help  right away if you are not doing well or get worse. Document Released: 09/01/2004 Document Revised: 09/17/2013 Document Reviewed: 06/24/2013 Exit[employer] Patient Information 2014 [employer].  

## 2014-05-08 NOTE — Progress Notes (Signed)
Subjective:    Patient ID: Mary Andersen, female    DOB: 11/20/1986, 28 y.o.   MRN: [id number]  HPI  Pt presents to the clinic today with multiple concerns.   1- She c/o pain in her left ear. She went to UC 2 weeks ago and was diagnosed with an ear infection. She was started on amoxicillin. She took it for a few days then became really nauseated so she stopped taking it. Now the pain has returned. She denies fever, chills or loss of hearing out of that ear.  2- She is feeling very overwhelmed lately. Her husband just go a new job, they just moved and she has a 21 month old child. She would like to go back up on her zoloft to 100 mg daily. She has been on this dose in the past. She does have a good support group. She denies SI/HI.  Review of Systems      Past Medical History  Diagnosis Date  . Vertigo   . 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 Prenatal Vit-Fe Fumarate-FA (MULTIVITAMIN-PRENATAL) 27-0.8 MG TABS tablet Take 1 tablet by mouth daily at 12 noon.      . cefdinir (OMNICEF) 300 MG capsule Take 1 capsule (300 mg total) by mouth 2 (two) times daily.  20 capsule  0  . sertraline (ZOLOFT) 100 MG tablet Take 1 tablet (100 mg total) by mouth daily.  30 tablet  2   No current facility-administered medications for this visit.    No Known Allergies  Family History  Problem Relation Age of Onset  . Adopted: Yes    History   Social History  . Marital Status: Married    Spouse Name: N/A    Number of Children: N/A  . Years of Education: N/A   Occupational History  . Not on file.   Social History Main Topics  . Smoking status: Never Smoker   . Smokeless tobacco: Not on file  . Alcohol Use: No  . Drug Use: No  . Sexual Activity: Not Currently   Other Topics Concern  . Not on file   Social History Narrative   ** Merged History Encounter **       Married.   Currently pregnant with  first child- due January 2015.   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           Constitutional: Denies fever, malaise, fatigue, headache or abrupt weight changes.  HEENT: Pt reports left ear pain. Denies eye pain, eye redness,  ringing in the ears, wax buildup, runny nose, nasal congestion, bloody nose, or sore throat. Psych: Pt reports depression. Denies anxiety, SI/HI.  No other specific complaints in a complete review of systems (except as listed in HPI above).  Objective:   Physical Exam   BP 104/68  Pulse 63  Temp(Src) 98.2 F (36.8 C) (Oral)  Wt 130 lb 12 oz (59.308 kg)  SpO2 99% Wt Readings from Last 3 Encounters:  05/08/14 130 lb 12 oz (59.308 kg)  12/28/13 144 lb (65.318 kg)  05/27/13 140 lb (63.504 kg)    General: Appears her stated age, well developed, well nourished in NAD. HEENT: Head: normal shape and size; Eyes: sclera white, no icterus, conjunctiva pink, PERRLA and EOMs intact; Ears: Tm's redand intact, distorted light reflex;  Cardiovascular: Normal rate and rhythm. S1,S2 noted.  No murmur, rubs or  gallops noted. No JVD or BLE edema. No carotid bruits noted. Pulmonary/Chest: Normal effort and positive vesicular breath sounds. No respiratory distress. No wheezes, rales or ronchi noted.  Psychiatric: Mood and affect normal. Behavior is normal. Judgment and thought content normal.     BMET    Component Value Date/Time   NA 136* 12/28/2013 0515   K 4.1 12/28/2013 0515   CL 101 12/28/2013 0515   CO2 21 12/28/2013 0515   GLUCOSE 82 12/28/2013 0515   BUN 8 12/28/2013 0515   CREATININE 0.46* 12/28/2013 0515   CREATININE 0.57 04/29/2012 0843   CALCIUM 9.8 12/28/2013 0515   GFRNONAA >90 12/28/2013 0515   GFRAA >90 12/28/2013 0515    Lipid Panel     Component Value Date/Time   CHOL 195 05/27/2013 1116   TRIG 75.0 05/27/2013 1116   HDL 55.00 05/27/2013 1116   CHOLHDL 4 05/27/2013 1116   VLDL 15.0 05/27/2013 1116   LDLCALC 125* 05/27/2013  1116    CBC    Component Value Date/Time   WBC 9.1 12/29/2013 0550   WBC 4.8 04/29/2012 0847   RBC 3.54* 12/29/2013 0550   RBC 4.01* 04/29/2012 0847   HGB 10.5* 12/29/2013 0550   HGB 11.4* 04/29/2012 0847   HCT 30.5* 12/29/2013 0550   HCT 35.4* 04/29/2012 0847   PLT 140* 12/29/2013 0550   MCV 86.2 12/29/2013 0550   MCV 88.3 04/29/2012 0847   MCH 29.7 12/29/2013 0550   MCH 28.4 04/29/2012 0847   MCHC 34.4 12/29/2013 0550   MCHC 32.2 04/29/2012 0847   RDW 13.0 12/29/2013 0550   LYMPHSABS 1.6 05/27/2013 1116   MONOABS 0.6 05/27/2013 1116   EOSABS 0.0 05/27/2013 1116   BASOSABS 0.0 05/27/2013 1116    Hgb A1C No results found for this basename: HGBA1C        Assessment & Plan:   Left Otitis Media:  Will try Omnicef BID x 10 days Take as directed  RTC as needed

## 2014-05-15 ENCOUNTER — Telehealth: Payer: Self-pay

## 2014-05-15 MED ORDER — AMOXICILLIN 875 MG PO TABS: 875.0000 mg | ORAL_TABLET | Freq: Two times a day (BID) | ORAL | Status: DC

## 2014-05-15 NOTE — Telephone Encounter (Signed)
Message left notifying patient.

## 2014-05-15 NOTE — Telephone Encounter (Signed)
plz notify amoxicillin sent to pharmacy [facility].

## 2014-05-15 NOTE — Telephone Encounter (Signed)
Pt was seen on 05/08/14 and started on omnicef 300 mg taking one twice a day for lt otitis media . Pt had previously been seen at [facility] for ear infection and given amoxicillin. Amoxicillin helped the earache more than omnicef but amoxicillin caused nausea (no vomiting). Pt wants to get amoxicillin rx sent to [facility] because for last 3 days pt's ear had been hurting.No fever or other symptoms. Pt request cb. Dr Dayton Martes and Nicki Reaper NP has already left office.

## 2014-07-06 ENCOUNTER — Ambulatory Visit (INDEPENDENT_AMBULATORY_CARE_PROVIDER_SITE_OTHER): Payer: BC Managed Care – PPO | Admitting: Family Medicine

## 2014-07-06 ENCOUNTER — Encounter: Payer: Self-pay | Admitting: Family Medicine

## 2014-07-06 VITALS — BP 118/66 | HR 74 | Temp 98.2°F | Ht 61.0 in | Wt 132.8 lb

## 2014-07-06 DIAGNOSIS — F3289 Other specified depressive episodes: Secondary | ICD-10-CM

## 2014-07-06 DIAGNOSIS — F329 Major depressive disorder, single episode, unspecified: Secondary | ICD-10-CM

## 2014-07-06 DIAGNOSIS — Z0000 Encounter for general adult medical examination without abnormal findings: Secondary | ICD-10-CM

## 2014-07-06 DIAGNOSIS — Z136 Encounter for screening for cardiovascular disorders: Secondary | ICD-10-CM

## 2014-07-06 DIAGNOSIS — F32A Depression, unspecified: Secondary | ICD-10-CM

## 2014-07-06 LAB — CBC WITH DIFFERENTIAL/PLATELET
Basophils Absolute: 0 10*3/uL (ref 0.0–0.1)
Basophils Relative: 0.4 % (ref 0.0–3.0)
Eosinophils Absolute: 0.1 10*3/uL (ref 0.0–0.7)
Eosinophils Relative: 1.9 % (ref 0.0–5.0)
HCT: 35.3 % — ABNORMAL LOW (ref 36.0–46.0)
Hemoglobin: 12 g/dL (ref 12.0–15.0)
Lymphocytes Relative: 27.6 % (ref 12.0–46.0)
Lymphs Abs: 1.5 10*3/uL (ref 0.7–4.0)
MCHC: 34 g/dL (ref 30.0–36.0)
MCV: 86.4 fl (ref 78.0–100.0)
Monocytes Absolute: 0.6 10*3/uL (ref 0.1–1.0)
Monocytes Relative: 10.4 % (ref 3.0–12.0)
Neutro Abs: 3.2 10*3/uL (ref 1.4–7.7)
Neutrophils Relative %: 59.7 % (ref 43.0–77.0)
Platelets: 204 10*3/uL (ref 150.0–400.0)
RBC: 4.08 Mil/uL (ref 3.87–5.11)
RDW: 12.1 % (ref 11.5–15.5)
WBC: 5.4 10*3/uL (ref 4.0–10.5)

## 2014-07-06 LAB — LIPID PANEL
Cholesterol: 172 mg/dL (ref 0–200)
HDL: 49.7 mg/dL (ref >39.00)
LDL Cholesterol: 113 mg/dL — ABNORMAL HIGH (ref 0–99)
NonHDL: 122.3
Total CHOL/HDL Ratio: 3
Triglycerides: 45 mg/dL (ref 0.0–149.0)
VLDL: 9 mg/dL (ref 0.0–40.0)

## 2014-07-06 LAB — COMPREHENSIVE METABOLIC PANEL
ALT: 18 U/L (ref 0–35)
AST: 20 U/L (ref 0–37)
Albumin: 4.2 g/dL (ref 3.5–5.2)
Alkaline Phosphatase: 76 U/L (ref 39–117)
BUN: 21 mg/dL (ref 6–23)
CO2: 29 mEq/L (ref 19–32)
Calcium: 9.4 mg/dL (ref 8.4–10.5)
Chloride: 103 mEq/L (ref 96–112)
Creatinine, Ser: 0.5 mg/dL (ref 0.4–1.2)
GFR: 151.25 mL/min (ref >60.00)
Glucose, Bld: 88 mg/dL (ref 70–99)
Potassium: 4 mEq/L (ref 3.5–5.1)
Sodium: 138 mEq/L (ref 135–145)
Total Bilirubin: 0.4 mg/dL (ref 0.2–1.2)
Total Protein: 7.8 g/dL (ref 6.0–8.3)

## 2014-07-06 LAB — TSH: TSH: 1.17 u[IU]/mL (ref 0.35–4.50)

## 2014-07-06 NOTE — Assessment & Plan Note (Signed)
Improved but still symptomatic. Discussed options of changing rx and or psych referral. She agrees to psych referral. Referral placed.

## 2014-07-06 NOTE — Patient Instructions (Addendum)
It was great to see you. Hang in there.  Please stop by to see Shirlee LimerickMarion on your way out.  I will call you with your lab results.

## 2014-07-06 NOTE — Progress Notes (Signed)
Pre visit review using our clinic review tool, if applicable. No additional management support is needed unless otherwise documented below in the visit note. 

## 2014-07-06 NOTE — Progress Notes (Signed)
Subjective:   Patient ID: Mary Andersen, female    DOB: 03/09/1986, 28 y.o.   MRN: [id number]  Mary Andersen is a pleasant 28 y.o. year old female who presents to clinic today with Follow-up  on 07/06/2014  HPI: Rosendo Gros on 5/29- note reviewed.   She had been feeling very overwhelmed lately- frustrated, moody, anxious. Her husband just go a new job, they just moved ( have to sell their house) and she has a Development worker, international aid.  She asked to increase her zoloft to 29 mg daily as she had been on that dose in the past.  [name] increased her dose to 100 mg daily.  She does have a good support group. She denies SI/HI but she still feels moodier than usual.  Father died last Aug 06, 2023- never had a chance to really grieve.  Needs labs done for CPX for work.  Current Outpatient Prescriptions on File Prior to Visit  Medication Sig Dispense Refill  . cefdinir (OMNICEF) 300 MG capsule Take 1 capsule (300 mg total) by mouth 2 (two) times daily.  20 capsule  0  . Prenatal Vit-Fe Fumarate-FA (MULTIVITAMIN-PRENATAL) 27-0.8 MG TABS tablet Take 1 tablet by mouth daily at 12 noon.      . sertraline (ZOLOFT) 100 MG tablet Take 1 tablet (100 mg total) by mouth daily.  30 tablet  2   No current facility-administered medications on file prior to visit.    No Known Allergies  Past Medical History  Diagnosis Date  . Vertigo   . 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  Family History  Problem Relation Age of Onset  . Adopted: Yes    History   Social History  . Marital Status: Married    Spouse Name: N/A    Number of Children: N/A  . Years of Education: N/A   Occupational History  . Not on file.   Social History Main Topics  . Smoking status: Never Smoker   . Smokeless tobacco: Not on file  . Alcohol Use: No  . Drug Use: No  . Sexual Activity: Not Currently   Other Topics Concern  . Not on file   Social History Narrative   ** Merged History  Encounter **       Married.   Currently pregnant with first child- due January 29.   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         The PMH, PSH, Social History, Family History, Medications, and allergies have been reviewed in [facility], and have been updated if relevant.   Review of Systems    See HPI Sleeping ok No SI or HI Appetite good- weight stable Wt Readings from Last 3 Encounters:  07/06/14 132 lb 12 oz (60.215 kg)  05/08/14 130 lb 12 oz (59.308 kg)  12/28/13 144 lb (65.318 kg)    Objective:    BP 118/66  Pulse 74  Temp(Src) 98.2 F (36.8 C) (Oral)  Ht 5\' 1"  (1.549 m)  Wt 132 lb 12 oz (60.215 kg)  BMI 25.10 kg/m2  SpO2 93%  Breastfeeding? Yes   Physical Exam  examination  Head: normocephalic and atraumatic.  Eyes: vision grossly intact, pupils equal, pupils round, and pupils reactive to light.  Left arm congential defect  Neurologic: alert & oriented X3 and gait normal.  Skin: Intact without suspicious lesions or rashes  Psych: Cognition and judgment appear intact. Alert and cooperative  with normal attention span and concentration. No apparent delusions, illusions, hallucinations       Assessment & Plan:   Depression No Follow-up on file.

## 2014-07-07 ENCOUNTER — Encounter: Payer: Self-pay | Admitting: *Deleted

## 2014-07-22 ENCOUNTER — Ambulatory Visit (INDEPENDENT_AMBULATORY_CARE_PROVIDER_SITE_OTHER): Payer: BC Managed Care – PPO | Admitting: Psychology

## 2014-07-22 DIAGNOSIS — F4323 Adjustment disorder with mixed anxiety and depressed mood: Secondary | ICD-10-CM

## 2014-08-11 ENCOUNTER — Ambulatory Visit: Payer: BC Managed Care – PPO | Admitting: Psychology

## 2014-08-14 ENCOUNTER — Other Ambulatory Visit: Payer: Self-pay | Admitting: Internal Medicine

## 2014-08-14 NOTE — Telephone Encounter (Signed)
Last filled 04/2014 with 2 refills--pt had f/u with you 07/06/14--please advise

## 2014-08-20 ENCOUNTER — Encounter: Payer: BC Managed Care – PPO | Admitting: Family Medicine

## 2014-09-04 ENCOUNTER — Encounter: Payer: Self-pay | Admitting: Family Medicine

## 2014-09-04 ENCOUNTER — Ambulatory Visit (INDEPENDENT_AMBULATORY_CARE_PROVIDER_SITE_OTHER): Payer: BC Managed Care – PPO | Admitting: Family Medicine

## 2014-09-04 VITALS — BP 120/82 | HR 63 | Temp 97.9°F | Ht 60.75 in | Wt 136.0 lb

## 2014-09-04 DIAGNOSIS — F32A Depression, unspecified: Secondary | ICD-10-CM

## 2014-09-04 DIAGNOSIS — F3289 Other specified depressive episodes: Secondary | ICD-10-CM

## 2014-09-04 DIAGNOSIS — Z0000 Encounter for general adult medical examination without abnormal findings: Secondary | ICD-10-CM

## 2014-09-04 DIAGNOSIS — F329 Major depressive disorder, single episode, unspecified: Secondary | ICD-10-CM

## 2014-09-04 NOTE — Assessment & Plan Note (Signed)
Reviewed preventive care protocols, scheduled due services, and updated immunizations Discussed nutrition, exercise, diet, and healthy lifestyle.  

## 2014-09-04 NOTE — Progress Notes (Signed)
Pre visit review using our clinic review tool, if applicable. No additional management support is needed unless otherwise documented below in the visit note. 

## 2014-09-04 NOTE — Assessment & Plan Note (Signed)
Well controlled on current rx. No changes made. 

## 2014-09-04 NOTE — Progress Notes (Signed)
Subjective:   Patient ID: Mary Andersen, female    DOB: 06-02-1986, 28 y.o.   MRN: [id number]  Mary Andersen is a pleasant 28 y.o. year old female who presents to clinic today with Annual Exam  on 09/04/2014  HPI: Has GYN (Dr. Renaldo Fiddler)- had a baby on 12/28/13.  Depression-   She had been feeling very overwhelmed lately- frustrated, moody, anxious. Her husband just go a new job, they just moved ( have to sell their house) and she has a Development worker, international aid. She asked to increase her zoloft to 100 mg daily as she had been on that dose in the past. [name] increased her dose to 100 mg daily.  Father died last 08/17/2023- never had a chance to really grieve. When I saw her for follow up in July, she felt symptoms were improved but remained symptomatic.  I therefore referred her for psychotherapy.  She went once but feels she is coping better now.  Husband is very supportive. Loves being a mom.  Lab Results  Component Value Date   CHOL 172 07/06/2014   HDL 49.70 07/06/2014   LDLCALC 113* 07/06/2014   TRIG 45.0 07/06/2014   CHOLHDL 3 07/06/2014   Lab Results  Component Value Date   WBC 5.4 07/06/2014   HGB 12.0 07/06/2014   HCT 35.3* 07/06/2014   MCV 86.4 07/06/2014   PLT 204.0 07/06/2014   Lab Results  Component Value Date   CREATININE 0.5 07/06/2014   Lab Results  Component Value Date   TSH 1.17 07/06/2014   Current Outpatient Prescriptions on File Prior to Visit  Medication Sig Dispense Refill  . cefdinir (OMNICEF) 300 MG capsule Take 1 capsule (300 mg total) by mouth 2 (two) times daily.  20 capsule  0  . Prenatal Vit-Fe Fumarate-FA (MULTIVITAMIN-PRENATAL) 27-0.8 MG TABS tablet Take 1 tablet by mouth daily at 12 noon.      . sertraline (ZOLOFT) 100 MG tablet TAKE 1 TABLET (100 MG TOTAL) BY MOUTH DAILY.  30 tablet  2   No current facility-administered medications on file prior to visit.    No Known Allergies  Past Medical History  Diagnosis Date  . Vertigo   . Congenital absence of both forearm  and hand left    No past surgical history on file.  Family History  Problem Relation Age of Onset  . Adopted: Yes    History   Social History  . Marital Status: Married    Spouse Name: N/A    Number of Children: N/A  . Years of Education: N/A   Occupational History  . Not on file.   Social History Main Topics  . Smoking status: Never Smoker   . Smokeless tobacco: Not on file  . Alcohol Use: No  . Drug Use: No  . Sexual Activity: Not Currently   Other Topics Concern  . Not on file   Social History Narrative   ** Merged History Encounter **       Married.   Currently pregnant with first child- due January 2015.   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         The PMH, PSH, Social History, Family History, Medications, and allergies have been reviewed in [facility], and have been updated if relevant.   Review of Systems    See HPI Objective:    BP 120/82  Pulse 63  Temp(Src) 97.9 F (36.6 C) (Oral)  Ht 5' 0.75" (1.543 m)  Wt 136  lb (61.689 kg)  BMI 25.91 kg/m2  SpO2 98%  LMP 07/28/2014  Breastfeeding? Yes  Wt Readings from Last 3 Encounters:  09/04/14 136 lb (61.689 kg)  07/06/14 132 lb 12 oz (60.215 kg)  05/08/14 130 lb 12 oz (59.308 kg)    Physical Exam   General:  Well-developed,well-nourished,in no acute distress; alert,appropriate and cooperative throughout examination Head:  normocephalic and atraumatic.   Eyes:  vision grossly intact, pupils equal, pupils round, and pupils reactive to light.   Ears:  R ear normal and L ear normal.   Nose:  no external deformity.   Mouth:  good dentition.   Lungs:  Normal respiratory effort, chest expands symmetrically. Lungs are clear to auscultation, no crackles or wheezes. Heart:  Normal rate and regular rhythm. S1 and S2 normal without gallop, murmur, click, rub or other extra sounds. Abdomen:  Bowel sounds positive,abdomen soft and non-tender without masses, organomegaly or hernias  noted. Msk:  No deformity or scoliosis noted of thoracic or lumbar spine.   Extremities:  No clubbing, cyanosis, edema, or deformity noted with normal full range of motion of all joints.   Neurologic:  alert & oriented X3 and gait normal.   Skin:  Intact without suspicious lesions or rashes Cervical Nodes:  No lymphadenopathy noted Axillary Nodes:  No palpable lymphadenopathy Psych:  Cognition and judgment appear intact. Alert and cooperative with normal attention span and concentration. No apparent delusions, illusions, hallucinations       Assessment & Plan:   Routine general medical examination at a health care facility  Depression No Follow-up on file.

## 2014-10-12 ENCOUNTER — Encounter: Payer: Self-pay | Admitting: Family Medicine

## 2014-12-02 ENCOUNTER — Other Ambulatory Visit: Payer: Self-pay | Admitting: *Deleted

## 2014-12-02 MED ORDER — SERTRALINE HCL 100 MG PO TABS: ORAL_TABLET | ORAL | Status: DC

## 2015-02-08 ENCOUNTER — Encounter: Payer: Self-pay | Admitting: Internal Medicine

## 2015-02-08 ENCOUNTER — Ambulatory Visit (INDEPENDENT_AMBULATORY_CARE_PROVIDER_SITE_OTHER): Payer: BLUE CROSS/BLUE SHIELD | Admitting: Internal Medicine

## 2015-02-08 VITALS — BP 118/84 | HR 69 | Temp 98.1°F | Wt 136.0 lb

## 2015-02-08 DIAGNOSIS — J029 Acute pharyngitis, unspecified: Secondary | ICD-10-CM

## 2015-02-08 LAB — POCT RAPID STREP A (OFFICE): Rapid Strep A Screen: NEGATIVE

## 2015-02-08 MED ORDER — METHYLPREDNISOLONE ACETATE 80 MG/ML IJ SUSP
80.0000 mg | Freq: Once | INTRAMUSCULAR | Status: AC
  Administered 2015-02-08: 80 mg via INTRAMUSCULAR

## 2015-02-08 NOTE — Progress Notes (Signed)
HPI  Pt presents to the clinic today with c/o facial pressure, sore throat and cough. She reports this started 1 week ago. The cough is not productive. She denies fever, chills or body aches. She has tried tylenol without relief. She has no history of allergies or breathing problems. She has had sick contacts. She does not smoke. She is breastfeeding. Review of Systems    Past Medical History  Diagnosis Date  . Vertigo   . 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 Adopted: Yes    History   Social History  . Marital Status: Married    Spouse Name: N/A  . Number of Children: N/A  . Years of Education: N/A   Occupational History  . Not on file.   Social History Main Topics  . Smoking status: Never Smoker   . Smokeless tobacco: Not on file  . Alcohol Use: No  . Drug Use: No  . Sexual Activity: Not Currently   Other Topics Concern  . Not on file   Social History Narrative   ** Merged History Encounter **       Married.   Currently pregnant with first child- due January 2015.   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          No Known Allergies   Constitutional: Positive headache, fatigue and fever. Denies headache, fatigue, fever or abrupt weight changes.  HEENT:  Positive facial pain, and sore throat. Denies eye redness, ear pain, ringing in the ears, wax buildup, runny nose or nasal congestion. Respiratory: Positive cough. Denies difficulty breathing or shortness of breath.  Cardiovascular: Denies chest pain, chest tightness, palpitations or swelling in the hands or feet.   No other specific complaints in a complete review of systems (except as listed in HPI above).  Objective:  BP 118/84 mmHg  Pulse 69  Temp(Src) 98.1 F (36.7 C) (Oral)  Wt 136 lb (61.689 kg)  SpO2 99%  LMP 01/24/2015  Breastfeeding? Yes   General: Appears his stated age, well developed, well  nourished in NAD. HEENT: Head: normal shape and size, nosinus tenderness noted; Eyes: sclera white, no icterus, conjunctiva pink; Ears: Tm's gray and intact, normal light reflex; Throat/Mouth: Teeth present, mucosa erythematousand moist, Tonsil 2 + without exudate noted, no lesions or ulcerations noted.   Neck: Cervical adenopathy noted. Cardiovascular: Normal rate and rhythm. S1,S2 noted.  No murmur, rubs or gallops noted.  Pulmonary/Chest: Normal effort and positive vesicular breath sounds. No respiratory distress. No wheezes, rales or ronchi noted.      Assessment & Plan:   Sore throat  RST: negative 80 mg Depo IM today Advised her to take 600 mg Ibuprofen Q8H prn Salt water gargles may also be helpful  Watch for fever, worsening pain or difficulty swallowing  RTC as needed or if symptoms persist.

## 2015-02-08 NOTE — Patient Instructions (Signed)

## 2015-02-08 NOTE — Progress Notes (Signed)
   Subjective:    Patient ID: Mary DingwallJessica Andersen, female    DOB: [birth year], 29 y.o.   MRN: [id number]  HPI [name] is a 29 year old female who presents today with chief complaint of facial pressure and cough.  She also complains of sore throat that began one week ago.  Facial pressure and cough began 2 days ago.  Denies fever and chills.  Her husband has been ill with an ear infection and another family member had strep throat last week.  Patient is currently breastfeeding.   Review of Systems  Constitutional: Negative for fever, chills and fatigue.  HENT: Positive for sinus pressure and sore throat. Negative for congestion, postnasal drip, rhinorrhea and trouble swallowing.   Respiratory: Positive for cough. Negative for shortness of breath and wheezing.   Cardiovascular: Negative for chest pain, palpitations and leg swelling.  Skin: Negative for color change, pallor, rash and wound.  Neurological: Negative for light-headedness and headaches.   Past Medical History  Diagnosis Date  . Vertigo   . 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 Adopted: Yes   Current Outpatient Prescriptions on File Prior to Visit  Medication Sig Dispense Refill  . Prenatal Vit-Fe Fumarate-FA (MULTIVITAMIN-PRENATAL) 27-0.8 MG TABS tablet Take 1 tablet by mouth daily at 12 noon.    . sertraline (ZOLOFT) 100 MG tablet TAKE 1 TABLET (100 MG TOTAL) BY MOUTH DAILY. 30 tablet 2   No current facility-administered medications on file prior to visit.        Objective:   Physical Exam  Constitutional: She is oriented to person, place, and time. She appears well-developed and well-nourished.  HENT:  Head: Normocephalic and atraumatic.  Right Ear: External ear normal.  Left Ear: External ear normal.  Mouth/Throat: Oropharynx is clear and moist. No oropharyngeal exudate.  Throat is mildly swollen and tonsils are 2+.  Patient states she   Neck: Normal  range of motion. Neck supple. No thyromegaly present.  Cardiovascular: Normal rate, regular rhythm and normal heart sounds.   Pulmonary/Chest: Effort normal and breath sounds normal.  Abdominal: Soft. Bowel sounds are normal.  Musculoskeletal: Normal range of motion.  Lymphadenopathy:    She has no cervical adenopathy.  Neurological: She is alert and oriented to person, place, and time.  Skin: Skin is warm and dry.  BP 118/84 mmHg  Pulse 69  Temp(Src) 98.1 F (36.7 C) (Oral)  Wt 136 lb (61.689 kg)  SpO2 99%  LMP 01/24/2015  Breastfeeding? Yes         Assessment & Plan:  1. Viral sore throat  Will give 80mg  methylprednisone IM in office for swelling.  Rapid strep test negative.  Advised patient to take ibuprofen every 6 hours as needed.

## 2015-02-08 NOTE — Progress Notes (Signed)
Pre visit review using our clinic review tool, if applicable. No additional management support is needed unless otherwise documented below in the visit note. 

## 2015-03-09 ENCOUNTER — Ambulatory Visit (INDEPENDENT_AMBULATORY_CARE_PROVIDER_SITE_OTHER): Payer: BLUE CROSS/BLUE SHIELD | Admitting: Family Medicine

## 2015-03-09 ENCOUNTER — Encounter: Payer: Self-pay | Admitting: Family Medicine

## 2015-03-09 VITALS — BP 118/82 | HR 75 | Temp 98.3°F | Wt 135.5 lb

## 2015-03-09 DIAGNOSIS — F329 Major depressive disorder, single episode, unspecified: Secondary | ICD-10-CM | POA: Diagnosis not present

## 2015-03-09 DIAGNOSIS — F32A Depression, unspecified: Secondary | ICD-10-CM

## 2015-03-09 MED ORDER — SERTRALINE HCL 100 MG PO TABS: ORAL_TABLET | ORAL | Status: DC

## 2015-03-09 NOTE — Progress Notes (Signed)
Pre visit review using our clinic review tool, if applicable. No additional management support is needed unless otherwise documented below in the visit note. 

## 2015-03-09 NOTE — Assessment & Plan Note (Signed)
>  25 minutes spent in face to face time with patient, >50% spent in counselling or coordination of care Deteriorated. She already increased her zoloft to 150 mg daily and is pleased with results- continue this current dose- eRx sent.  We discussed psychotherapy today.  She did go to Dr. Laymond PurserPerrin but now has been talking to her mother's therapist on the phone and feels this is more helpful. Call or return to clinic prn if these symptoms worsen or fail to improve as anticipated. The patient indicates understanding of these issues and agrees with the plan.

## 2015-03-09 NOTE — Progress Notes (Signed)
Subjective:   Patient ID: Mary Andersen, female    DOB: 1985-12-31, 29 y.o.   MRN: [id number]  Mary DingwallJessica Mccubbins is a pleasant 29 y.o. year old female who presents to clinic today with Follow-up  on 03/09/2015  HPI:    Depression- having a lot of stressors at work and with family.  Her daughter is 1014 months old but an "easy" baby so she feels motherhood actually raises her spirits and keeps her centered.  Had been taking zoloft 100 mg daily.  About a month ago, started to feel sad and overwhelmed and she increased her dose on her own to 150 mg daily.  Feels much better.   Denies feeling depressed or anxious currently.  Still has a lot of stressors so does feel overwhelmed at times. Good support system at home and at work.  Denies SI or HI. Current Outpatient Prescriptions on File Prior to Visit  Medication Sig Dispense Refill  . JUNEL FE 1/20 1-20 MG-MCG tablet Take 1 tablet by mouth daily.  6  . Prenatal Vit-Fe Fumarate-FA (MULTIVITAMIN-PRENATAL) 27-0.8 MG TABS tablet Take 1 tablet by mouth daily at 12 noon.    . sertraline (ZOLOFT) 100 MG tablet TAKE 1 TABLET (100 MG TOTAL) BY MOUTH DAILY. 30 tablet 2   No current facility-administered medications on file prior to visit.    No Known Allergies  Past Medical History  Diagnosis Date  . Vertigo   . 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 No pertinent past surgical history.  Family History  Problem Relation Age of Onset  . Adopted: Yes    History   Social History  . Marital Status: Married    Spouse Name: N/A  . Number of Children: N/A  . Years of Education: N/A   Occupational History  . Not on file.   Social History Main Topics  . Smoking status: Never Smoker   . Smokeless tobacco: Not on file  . Alcohol Use: No  . Drug Use: No  . Sexual Activity: Not Currently   Other Topics Concern  . Not on file   Social History Narrative   ** Merged History Encounter **       Married.     Currently pregnant with first child- due January 2015.   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.         The PMH, PSH, Social History, Family History, Medications, and allergies [facility], and have been updated if relevant.   Review of Systems    See HPI Sleeping ok No SI or HI Appetite good- weight stable Wt Readings from Last 3 Encounters:  03/09/15 135 lb 8 oz (61.462 kg)  02/08/15 136 lb (61.689 kg)  09/04/14 136 lb (61.689 kg)    Objective:    BP 118/82 mmHg  Pulse 75  Temp(Src) 98.3 F (36.8 C) (Oral)  Wt 135 lb 8 oz (61.462 kg)  SpO2 95%  LMP 01/24/2015   Physical Exam  examination  Head: normocephalic and atraumatic.  Eyes: vision grossly intact, pupils equal, pupils round, and pupils reactive to light.  Left arm congential defect  Neurologic: alert & oriented X3 and gait normal.  Skin: Intact without suspicious lesions or rashes  Psych: Cognition and judgment appear intact. Alert and cooperative with normal attention span and concentration. No apparent delusions, illusions, hallucinations       Assessment & Plan:   Depression No Follow-up  on file.

## 2015-03-09 NOTE — Patient Instructions (Signed)
Great to see you. We are increasing your zoloft to 150 mg daily (1.5 tablets daily). Hang in there and keep me updated.

## 2015-03-18 ENCOUNTER — Encounter: Payer: Self-pay | Admitting: Primary Care

## 2015-03-18 ENCOUNTER — Ambulatory Visit (INDEPENDENT_AMBULATORY_CARE_PROVIDER_SITE_OTHER): Payer: BLUE CROSS/BLUE SHIELD | Admitting: Primary Care

## 2015-03-18 VITALS — BP 122/82 | HR 70 | Temp 98.4°F | Ht 60.75 in | Wt 136.0 lb

## 2015-03-18 DIAGNOSIS — H6692 Otitis media, unspecified, left ear: Secondary | ICD-10-CM

## 2015-03-18 MED ORDER — AMOXICILLIN 500 MG PO CAPS: 500.0000 mg | ORAL_CAPSULE | Freq: Two times a day (BID) | ORAL | Status: DC

## 2015-03-18 NOTE — Progress Notes (Signed)
   Subjective:    Patient ID: Mary Andersen, female    DOB: 02-08-1986, 29 y.o.   MRN: [id number]  HPI  Mary Andersen is a 29 year old female who presents today with a chief complaint of left ear pain that has been present since yesterday. Her ability to hear out of her left ear has diminished. Her daughter was diagnosed with an ear infection on Monday, but has had symptoms since last weekend. She denies fevers, chills, cough. She's taken ibuprofen and benadryl allergy for pain and sleep which has helped reduce her pain.   Review of Systems  Constitutional: Negative for fever and chills.  HENT: Positive for ear pain and sinus pressure. Negative for postnasal drip, sneezing and sore throat.   Respiratory: Negative for cough and shortness of breath.   Cardiovascular: Negative for chest pain.  Gastrointestinal: Negative for nausea and vomiting.  Neurological: Negative for headaches.       Past Medical History  Diagnosis Date  . Vertigo   . 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 Marital Status: Married    Spouse Name: N/A  . Number of Children: N/A  . Years of Education: N/A   Occupational History  . Not on file.   Social History Main Topics  . Smoking status: Never Smoker   . Smokeless tobacco: Not on file  . Alcohol Use: No  . Drug Use: No  . Sexual Activity: Not Currently   Other Topics Concern  . Not on file   Social History Narrative   ** Merged History Encounter **       Married.   Currently pregnant with first child- due January 2015.   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          No past surgical history on file.  Family History  Problem Relation Age of Onset  . Adopted: Yes    No Known Allergies  Current Outpatient Prescriptions on File Prior to Visit  Medication Sig Dispense Refill  . JUNEL FE 1/20 1-20 MG-MCG tablet Take 1 tablet by mouth daily.  6  . sertraline (ZOLOFT) 100  MG tablet Take 1.5 tablets daily 45 tablet 3   No current facility-administered medications on file prior to visit.    BP 122/82 mmHg  Pulse 70  Temp(Src) 98.4 F (36.9 C) (Oral)  Ht 5' 0.75" (1.543 m)  Wt 136 lb (61.689 kg)  BMI 25.91 kg/m2  SpO2 98%  LMP 03/04/2015    Objective:   Physical Exam  Constitutional: She is oriented to person, place, and time. She appears well-developed.  HENT:  Right Ear: Tympanic membrane and ear canal normal.  Left Ear: No tenderness. Tympanic membrane is erythematous and bulging. Decreased hearing is noted.  Eyes: Conjunctivae are normal. Pupils are equal, round, and reactive to light.  Neck: Neck supple.  Cardiovascular: Normal rate and regular rhythm.   Pulmonary/Chest: Effort normal and breath sounds normal.  Lymphadenopathy:    She has no cervical adenopathy.  Neurological: She is alert and oriented to person, place, and time.  Skin: Skin is warm and dry.          Assessment & Plan:  Acute Otitis Media: Left TM bulging with erythema. Non-tender. RX for Amoxicillin BID for 10 days. Ibuprofen for pain. Follow up as needed if no improvement.

## 2015-03-18 NOTE — Patient Instructions (Signed)
Start Amoxicillin antibiotic. Take one tablet by mouth twice daily for 10 days. You make take ibuprofen for pain. Follow up if no improvement in 3-4 days. Take care!  Otitis Media Otitis media is redness, soreness, and inflammation of the middle ear. Otitis media may be caused by allergies or, most commonly, by infection. Often it occurs as a complication of the common cold. SIGNS AND SYMPTOMS Symptoms of otitis media may include:  Earache.  Fever.  Ringing in your ear.  Headache.  Leakage of fluid from the ear. DIAGNOSIS To diagnose otitis media, your health care provider will examine your ear with an otoscope. This is an instrument that allows your health care provider to see into your ear in order to examine your eardrum. Your health care provider also will ask you questions about your symptoms. TREATMENT  Typically, otitis media resolves on its own within 3-5 days. Your health care provider may prescribe medicine to ease your symptoms of pain. If otitis media does not resolve within 5 days or is recurrent, your health care provider may prescribe antibiotic medicines if he or she suspects that a bacterial infection is the cause. HOME CARE INSTRUCTIONS   If you were prescribed an antibiotic medicine, finish it all even if you start to feel better.  Take medicines only as directed by your health care provider.  Keep all follow-up visits as directed by your health care provider. SEEK MEDICAL CARE IF:  You have otitis media only in one ear, or bleeding from your nose, or both.  You notice a lump on your neck.  You are not getting better in 3-5 days.  You feel worse instead of better. SEEK IMMEDIATE MEDICAL CARE IF:   You have pain that is not controlled with medicine.  You have swelling, redness, or pain around your ear or stiffness in your neck.  You notice that part of your face is paralyzed.  You notice that the bone behind your ear (mastoid) is tender when you touch  it. MAKE SURE YOU:   Understand these instructions.  Will watch your condition.  Will get help right away if you are not doing well or get worse. Document Released: 09/01/2004 Document Revised: 04/13/2014 Document Reviewed: 06/24/2013 [facility] Patient Information 2015 [employer] [place] [place]. This information is not intended to replace advice given to you by your health care provider. Make sure you discuss any questions you have with your health care provider.

## 2015-03-18 NOTE — Progress Notes (Signed)
Pre visit review using our clinic review tool, if applicable. No additional management support is needed unless otherwise documented below in the visit note. 

## 2015-03-23 ENCOUNTER — Other Ambulatory Visit: Payer: Self-pay | Admitting: Family Medicine

## 2015-05-31 ENCOUNTER — Encounter: Payer: Self-pay | Admitting: Family Medicine

## 2015-05-31 ENCOUNTER — Ambulatory Visit (INDEPENDENT_AMBULATORY_CARE_PROVIDER_SITE_OTHER): Payer: BLUE CROSS/BLUE SHIELD | Admitting: Family Medicine

## 2015-05-31 VITALS — BP 128/78 | HR 83 | Temp 98.5°F | Wt 134.5 lb

## 2015-05-31 DIAGNOSIS — M7581 Other shoulder lesions, right shoulder: Secondary | ICD-10-CM | POA: Diagnosis not present

## 2015-05-31 DIAGNOSIS — F32A Depression, unspecified: Secondary | ICD-10-CM

## 2015-05-31 DIAGNOSIS — F329 Major depressive disorder, single episode, unspecified: Secondary | ICD-10-CM

## 2015-05-31 LAB — COMPREHENSIVE METABOLIC PANEL
ALT: 12 U/L (ref 0–35)
AST: 15 U/L (ref 0–37)
Albumin: 4.3 g/dL (ref 3.5–5.2)
Alkaline Phosphatase: 59 U/L (ref 39–117)
BUN: 16 mg/dL (ref 6–23)
CO2: 30 mEq/L (ref 19–32)
Calcium: 9.5 mg/dL (ref 8.4–10.5)
Chloride: 102 mEq/L (ref 96–112)
Creatinine, Ser: 0.51 mg/dL (ref 0.40–1.20)
GFR: 150.32 mL/min (ref >60.00)
Glucose, Bld: 87 mg/dL (ref 70–99)
Potassium: 3.7 mEq/L (ref 3.5–5.1)
Sodium: 135 mEq/L (ref 135–145)
Total Bilirubin: 0.3 mg/dL (ref 0.2–1.2)
Total Protein: 7.4 g/dL (ref 6.0–8.3)

## 2015-05-31 LAB — CBC WITH DIFFERENTIAL/PLATELET
Basophils Absolute: 0 10*3/uL (ref 0.0–0.1)
Basophils Relative: 0.4 % (ref 0.0–3.0)
Eosinophils Absolute: 0.1 10*3/uL (ref 0.0–0.7)
Eosinophils Relative: 1.6 % (ref 0.0–5.0)
HCT: 35.1 % — ABNORMAL LOW (ref 36.0–46.0)
Hemoglobin: 11.8 g/dL — ABNORMAL LOW (ref 12.0–15.0)
Lymphocytes Relative: 24.5 % (ref 12.0–46.0)
Lymphs Abs: 1.2 10*3/uL (ref 0.7–4.0)
MCHC: 33.6 g/dL (ref 30.0–36.0)
MCV: 86.5 fl (ref 78.0–100.0)
Monocytes Absolute: 0.4 10*3/uL (ref 0.1–1.0)
Monocytes Relative: 8.6 % (ref 3.0–12.0)
Neutro Abs: 3.2 10*3/uL (ref 1.4–7.7)
Neutrophils Relative %: 64.9 % (ref 43.0–77.0)
Platelets: 210 10*3/uL (ref 150.0–400.0)
RBC: 4.06 Mil/uL (ref 3.87–5.11)
RDW: 13.3 % (ref 11.5–15.5)
WBC: 5 10*3/uL (ref 4.0–10.5)

## 2015-05-31 LAB — LIPID PANEL
Cholesterol: 176 mg/dL (ref 0–200)
HDL: 51.8 mg/dL (ref >39.00)
LDL Cholesterol: 112 mg/dL — ABNORMAL HIGH (ref 0–99)
NonHDL: 124.2
Total CHOL/HDL Ratio: 3
Triglycerides: 60 mg/dL (ref 0.0–149.0)
VLDL: 12 mg/dL (ref 0.0–40.0)

## 2015-05-31 LAB — HEMOGLOBIN A1C: Hgb A1c MFr Bld: 5.1 % (ref 4.6–6.5)

## 2015-05-31 MED ORDER — ARIPIPRAZOLE 2 MG PO TABS: 2.0000 mg | ORAL_TABLET | Freq: Every day | ORAL | Status: DC

## 2015-05-31 NOTE — Progress Notes (Signed)
Pre visit review using our clinic review tool, if applicable. No additional management support is needed unless otherwise documented below in the visit note. 

## 2015-05-31 NOTE — Patient Instructions (Signed)
Good to see you. Please stop by to see Shirlee Limerick on your way out. Come see me in 3 months.

## 2015-05-31 NOTE — Assessment & Plan Note (Signed)
>  25 minutes spent in face to face time with patient, >50% spent in counselling or coordination of care Deteriorated. At this point, I did advise psychiatry to help with medication management but she is declining.  We agreed upon very low dose Abilify as an adjuvant therapy.  eRx sent for Abilify 2 mg daily- labs today and follow up in 3 months (labs as well).  Also advise that she called me in 2-3 weeks with an update. The patient indicates understanding of these issues and agrees with the plan. Orders Placed This Encounter  Procedures  . CBC with Differential/Platelet  . Lipid panel  . Hemoglobin A1c  . Comprehensive metabolic panel  . Ambulatory referral to Physical Therapy    Referral Priority:  Routine    Referral Type:  Physical Medicine    Referral Reason:  Specialty Services Required    Requested Specialty:  Physical Therapy    Number of Visits Requested:  1

## 2015-05-31 NOTE — Assessment & Plan Note (Signed)
New- referral to PT. The patient indicates understanding of these issues and agrees with the plan.

## 2015-05-31 NOTE — Progress Notes (Signed)
   Subjective:   Patient ID: Mary Andersen, female    DOB: 10/13/1986, 29 y.o.   MRN: [id number]  Mary Andersen is a pleasant 29 y.o. year old female who presents to clinic today with Follow-up  on 05/31/2015  HPI:    Depression- having a lot of stressors at work and with family.  Her daughter is 69 months old but an "easy" baby so she feels motherhood actually raises her spirits and keeps her centered but she has noticed increased depression and moodiness. When I saw her in 02/2015, she had increased her zoloft to 150 mg daily and felt it had been working well until the past month or so.  Good support system at home and at work.  Husband is supportive.   Denies SI or HI.  Sleep ok.  Denies symptoms of mania.  Appetite good.  Current Outpatient Prescriptions on File Prior to Visit  Medication Sig Dispense Refill  . JUNEL FE 1/20 1-20 MG-MCG tablet Take 1 tablet by mouth daily.  6  . sertraline (ZOLOFT) 100 MG tablet Take 1.5 tablets daily 45 tablet 3   No current facility-administered medications on file prior to visit.    No Known Allergies  Past Medical History  Diagnosis Date  . Vertigo   . 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 No pertinent past surgical history.  Family History  Problem Relation Age of Onset  . Adopted: Yes    History   Social History  . Marital Status: Married    Spouse Name: N/A  . Number of Children: N/A  . Years of Education: N/A   Occupational History  . Not on file.   Social History Main Topics  . Smoking status: Never Smoker   . Smokeless tobacco: Not on file  . Alcohol Use: No  . Drug Use: No  . Sexual Activity: Not Currently   Other Topics Concern  . Not on file   Social History Narrative   ** Merged History Encounter **       Married.   Currently pregnant with first child- due January 2015.   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         The PMH, PSH, Social  History, Family History, Medications, and allergies have been reviewed in [facility], and have been updated if relevant.   Review of Systems    See HPI Sleeping ok No SI or HI Appetite good- weight stable Wt Readings from Last 3 Encounters:  05/31/15 134 lb 8 oz (61.009 kg)  03/18/15 136 lb (61.689 kg)  03/09/15 135 lb 8 oz (61.462 kg)    Objective:    BP 128/78 mmHg  Pulse 83  Temp(Src) 98.5 F (36.9 C) (Oral)  Wt 134 lb 8 oz (61.009 kg)  SpO2 97%  LMP 05/12/2015  Breastfeeding? No   Physical Exam  examination  Head: normocephalic and atraumatic.  Eyes: vision grossly intact, pupils equal, pupils round, and pupils reactive to light.  MSK Left arm congential defect, Right shoulder- + empty can, FROM Neurologic: alert & oriented X3 and gait normal.  Skin: Intact without suspicious lesions or rashes  Psych: Cognition and judgment appear intact. Alert and cooperative with normal attention span and concentration. No apparent delusions, illusions, hallucinations       Assessment & Plan:   Right rotator cuff tendinitis  Depression No Follow-up on file.

## 2015-06-30 ENCOUNTER — Ambulatory Visit (INDEPENDENT_AMBULATORY_CARE_PROVIDER_SITE_OTHER): Payer: BLUE CROSS/BLUE SHIELD | Admitting: Family Medicine

## 2015-06-30 ENCOUNTER — Encounter: Payer: Self-pay | Admitting: Family Medicine

## 2015-06-30 VITALS — BP 110/70 | HR 74 | Temp 97.7°F | Wt 137.0 lb

## 2015-06-30 DIAGNOSIS — R41840 Attention and concentration deficit: Secondary | ICD-10-CM | POA: Insufficient documentation

## 2015-06-30 DIAGNOSIS — F329 Major depressive disorder, single episode, unspecified: Secondary | ICD-10-CM

## 2015-06-30 DIAGNOSIS — F32A Depression, unspecified: Secondary | ICD-10-CM

## 2015-06-30 NOTE — Assessment & Plan Note (Signed)
Well controlled on current rxs. No changes made today. 

## 2015-06-30 NOTE — Assessment & Plan Note (Signed)
New- >25 minutes spent in face to face time with patient, >50% spent in counselling or coordination of care Refer to psych for formal ADD/ADHD testing. The patient indicates understanding of these issues and agrees with the plan.

## 2015-06-30 NOTE — Progress Notes (Signed)
   Subjective:   Patient ID: Mary Andersen, female    DOB: August 31, 1986, 29 y.o.   MRN: [id number]  Mary Andersen is a pleasant 29 y.o. year old female who presents to clinic today with discuss ADHD  on 06/30/2015  HPI:  ? ADD- was written up at work recently for not completing tasks.  She is upset by this because she has never been written up before but has noticed she is having a hard time focusing and completing tasks for past 6 months or more.  Depression- . When I saw her in 02/2015, she had increased her zoloft to 150 mg daily until 04/2015.  In 05/2015, we added Abilify 2 mg daily as adjuvant therapy.  She feels the Abilify has helped with her mood swings tremendously and is very pleased.   Current Outpatient Prescriptions on File Prior to Visit  Medication Sig Dispense Refill  . ARIPiprazole (ABILIFY) 2 MG tablet Take 1 tablet (2 mg total) by mouth daily. 30 tablet 1  . JUNEL FE 1/20 1-20 MG-MCG tablet Take 1 tablet by mouth daily.  6   No current facility-administered medications on file prior to visit.    No Known Allergies  Past Medical History  Diagnosis Date  . Vertigo   . 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 No pertinent past surgical history.  Family History  Problem Relation Age of Onset  . Adopted: Yes    History   Social History  . Marital Status: Married    Spouse Name: N/A  . Number of Children: N/A  . Years of Education: N/A   Occupational History  . Not on file.   Social History Main Topics  . Smoking status: Never Smoker   . Smokeless tobacco: Not on file  . Alcohol Use: No  . Drug Use: No  . Sexual Activity: Not Currently   Other Topics Concern  . Not on file   Social History Narrative   ** Merged History Encounter **       Married.   Currently pregnant with first child- due January 2015.   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         The PMH, PSH, Social History,  Family History, Medications, and allergies have [facility], and have been updated if relevant.   Review of Systems    See HPI Sleeping ok No SI or HI Appetite good- weight stable Wt Readings from Last 3 Encounters:  06/30/15 137 lb (62.143 kg)  05/31/15 134 lb 8 oz (61.009 kg)  03/18/15 136 lb (61.689 kg)    Objective:    BP 110/70 mmHg  Pulse 74  Temp(Src) 97.7 F (36.5 C) (Tympanic)  Wt 137 lb (62.143 kg)  SpO2 99%  LMP 05/12/2015   Physical Exam  Head: normocephalic and atraumatic.  Eyes: vision grossly intact, pupils equal, pupils round, and pupils reactive to light.  MSK Left arm congential defect Neurologic: alert & oriented X3 and gait normal.  Skin: Intact without suspicious lesions or rashes  Psych: Cognition and judgment appear intact. Alert and cooperative with normal attention span and concentration. No apparent delusions, illusions, hallucinations       Assessment & Plan:   Attention and concentration deficit  Depression No Follow-up on file.

## 2015-06-30 NOTE — Patient Instructions (Signed)
Great to see you. Please stop by to see Marion on your way out.   

## 2015-06-30 NOTE — Progress Notes (Signed)
Pre visit review using our clinic review tool, if applicable. No additional management support is needed unless otherwise documented below in the visit note. 

## 2015-07-20 ENCOUNTER — Other Ambulatory Visit: Payer: Self-pay | Admitting: Family Medicine

## 2015-07-23 ENCOUNTER — Other Ambulatory Visit: Payer: Self-pay | Admitting: Family Medicine

## 2015-08-04 ENCOUNTER — Ambulatory Visit (INDEPENDENT_AMBULATORY_CARE_PROVIDER_SITE_OTHER): Payer: BLUE CROSS/BLUE SHIELD | Admitting: Psychology

## 2015-08-04 DIAGNOSIS — F42 Obsessive-compulsive disorder: Secondary | ICD-10-CM

## 2015-08-04 DIAGNOSIS — F330 Major depressive disorder, recurrent, mild: Secondary | ICD-10-CM | POA: Diagnosis not present

## 2015-09-03 ENCOUNTER — Telehealth: Payer: Self-pay | Admitting: Family Medicine

## 2015-09-03 NOTE — Telephone Encounter (Signed)
Patient called and said she wanted to cancel her appointment on Monday.  Patient said it was a follow up on medication she was put on and she stopped taking the medication because she was gaining weight.

## 2015-09-04 ENCOUNTER — Other Ambulatory Visit: Payer: Self-pay | Admitting: Family Medicine

## 2015-09-06 ENCOUNTER — Ambulatory Visit: Payer: BLUE CROSS/BLUE SHIELD | Admitting: Family Medicine

## 2015-09-13 ENCOUNTER — Encounter: Payer: Self-pay | Admitting: Family Medicine

## 2015-09-13 ENCOUNTER — Ambulatory Visit (INDEPENDENT_AMBULATORY_CARE_PROVIDER_SITE_OTHER): Payer: BLUE CROSS/BLUE SHIELD | Admitting: Family Medicine

## 2015-09-13 VITALS — BP 114/70 | HR 83 | Temp 98.1°F | Wt 143.2 lb

## 2015-09-13 DIAGNOSIS — F329 Major depressive disorder, single episode, unspecified: Secondary | ICD-10-CM | POA: Diagnosis not present

## 2015-09-13 DIAGNOSIS — R41840 Attention and concentration deficit: Secondary | ICD-10-CM

## 2015-09-13 DIAGNOSIS — F32A Depression, unspecified: Secondary | ICD-10-CM

## 2015-09-13 LAB — COMPREHENSIVE METABOLIC PANEL
ALT: 14 U/L (ref 0–35)
AST: 19 U/L (ref 0–37)
Albumin: 4.3 g/dL (ref 3.5–5.2)
Alkaline Phosphatase: 63 U/L (ref 39–117)
BUN: 14 mg/dL (ref 6–23)
CO2: 28 mEq/L (ref 19–32)
Calcium: 9.8 mg/dL (ref 8.4–10.5)
Chloride: 101 mEq/L (ref 96–112)
Creatinine, Ser: 0.63 mg/dL (ref 0.40–1.20)
GFR: 117.57 mL/min (ref >60.00)
Glucose, Bld: 88 mg/dL (ref 70–99)
Potassium: 3.6 mEq/L (ref 3.5–5.1)
Sodium: 137 mEq/L (ref 135–145)
Total Bilirubin: 0.3 mg/dL (ref 0.2–1.2)
Total Protein: 8.1 g/dL (ref 6.0–8.3)

## 2015-09-13 LAB — CBC WITH DIFFERENTIAL/PLATELET
Basophils Absolute: 0 10*3/uL (ref 0.0–0.1)
Basophils Relative: 0.4 % (ref 0.0–3.0)
Eosinophils Absolute: 0.1 10*3/uL (ref 0.0–0.7)
Eosinophils Relative: 0.9 % (ref 0.0–5.0)
HCT: 36.9 % (ref 36.0–46.0)
Hemoglobin: 12.3 g/dL (ref 12.0–15.0)
Lymphocytes Relative: 22.5 % (ref 12.0–46.0)
Lymphs Abs: 1.8 10*3/uL (ref 0.7–4.0)
MCHC: 33.3 g/dL (ref 30.0–36.0)
MCV: 85.8 fl (ref 78.0–100.0)
Monocytes Absolute: 0.6 10*3/uL (ref 0.1–1.0)
Monocytes Relative: 7.6 % (ref 3.0–12.0)
Neutro Abs: 5.5 10*3/uL (ref 1.4–7.7)
Neutrophils Relative %: 68.6 % (ref 43.0–77.0)
Platelets: 249 10*3/uL (ref 150.0–400.0)
RBC: 4.3 Mil/uL (ref 3.87–5.11)
RDW: 13.1 % (ref 11.5–15.5)
WBC: 8.1 10*3/uL (ref 4.0–10.5)

## 2015-09-13 NOTE — Assessment & Plan Note (Signed)
Stable on current rx. No changes made. Labs today. Orders Placed This Encounter  Procedures  . CBC with Differential  . Comprehensive metabolic panel

## 2015-09-13 NOTE — Progress Notes (Signed)
Pre visit review using our clinic review tool, if applicable. No additional management support is needed unless otherwise documented below in the visit note. 

## 2015-09-13 NOTE — Progress Notes (Signed)
Subjective:   Patient ID: Mary Andersen, female    DOB: [birth year], 29 y.o.   MRN: [id number]  Mary Andersen is a pleasant 29 y.o. year old female who presents to clinic today with Follow-up  on 09/13/2015  HPI:  ? ADD- when I saw her in July, she had just been written up at work  for not completing tasks.  She complained of a hard time concentrating and focusing for at least 6 months duration.  I referred her to psych for formal ADD evaluation.  Evaluation was negative for ADD.  Depression- . When I saw her in 02/2015, she had increased her zoloft to 150 mg daily until 04/2015.  In 05/2015, we added Abilify 2 mg daily as adjuvant therapy.  She feels the Abilify has helped with her mood swings tremendously and is very pleased.  Here today because she was told that she needed to follow up.  Wt Readings from Last 3 Encounters:  09/13/15 143 lb 4 oz (64.978 kg)  06/30/15 137 lb (62.143 kg)  05/31/15 134 lb 8 oz (61.009 kg)    Current Outpatient Prescriptions on File Prior to Visit  Medication Sig Dispense Refill  . ARIPiprazole (ABILIFY) 2 MG tablet TAKE 1 TABLET (2 MG TOTAL) BY MOUTH DAILY. 30 tablet 5  . sertraline (ZOLOFT) 100 MG tablet TAKE 1 & 1/2 TABLETS DAILY 45 tablet 3   No current facility-administered medications on file prior to visit.    No Known Allergies  Past Medical History  Diagnosis Date  . Vertigo   . 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 No pertinent past surgical history.  Family History  Problem Relation Age of Onset  . Adopted: Yes    Social History   Social History  . Marital Status: Married    Spouse Name: N/A  . Number of Children: N/A  . Years of Education: N/A   Occupational History  . Not on file.   Social History Main Topics  . Smoking status: Never Smoker   . Smokeless tobacco: Not on file  . Alcohol Use: No  . Drug Use: No  . Sexual Activity: Not Currently   Other Topics Concern  . Not on file    Social History Narrative   ** Merged History Encounter **       Married.   Currently pregnant with first child- due January 2015.   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         The PMH, PSH, Social History, Family History, Medications, and allergies have been reviewed in [facility], and have been updated if relevant.   Review of Systems  Respiratory: Negative.   Cardiovascular: Negative.   Neurological: Negative.   Hematological: Negative.   Psychiatric/Behavioral: Negative.   All other systems reviewed and are negative.    Wt Readings from Last 3 Encounters:  09/13/15 143 lb 4 oz (64.978 kg)  06/30/15 137 lb (62.143 kg)  05/31/15 134 lb 8 oz (61.009 kg)    Objective:    BP 114/70 mmHg  Pulse 83  Temp(Src) 98.1 F (36.7 C) (Oral)  Wt 143 lb 4 oz (64.978 kg)  SpO2 95%  LMP 08/16/2015   Physical Exam  Head: normocephalic and atraumatic.  Eyes: vision grossly intact, pupils equal, pupils round, and pupils reactive to light.  MSK Left arm congential defect Neurologic: alert & oriented X3 and gait normal.  Skin: Intact without suspicious lesions or  rashes  Psych: Cognition and judgment appear intact. Alert and cooperative with normal attention span and concentration. No apparent delusions, illusions, hallucinations       Assessment & Plan:   Attention and concentration deficit  Depression No Follow-up on file.

## 2015-09-14 ENCOUNTER — Encounter: Payer: Self-pay | Admitting: *Deleted

## 2015-10-06 ENCOUNTER — Ambulatory Visit (INDEPENDENT_AMBULATORY_CARE_PROVIDER_SITE_OTHER): Payer: BLUE CROSS/BLUE SHIELD | Admitting: Family Medicine

## 2015-10-06 ENCOUNTER — Encounter: Payer: Self-pay | Admitting: Family Medicine

## 2015-10-06 VITALS — BP 120/68 | HR 72 | Temp 98.2°F | Wt 143.0 lb

## 2015-10-06 DIAGNOSIS — F32A Depression, unspecified: Secondary | ICD-10-CM

## 2015-10-06 DIAGNOSIS — F329 Major depressive disorder, single episode, unspecified: Secondary | ICD-10-CM | POA: Diagnosis not present

## 2015-10-06 MED ORDER — BUPROPION HCL ER (XL) 150 MG PO TB24: 150.0000 mg | ORAL_TABLET | Freq: Every day | ORAL | Status: DC

## 2015-10-06 NOTE — Patient Instructions (Signed)
Great to see you. We are starting Wellbutrin XL 150 mg daily. Please keep me updated. 

## 2015-10-06 NOTE — Assessment & Plan Note (Signed)
Deteriorated. >25 minutes spent in face to face time with patient, >50% spent in counselling or coordination of care Discussed tx options- declines referral to psychiatry for evaluation and medication adjustment. She would like trial of addition of wellbutrin.  eRx sent for Wellbutrin 150 mg XL daily. She will call me with an updated in 2-3 weeks.

## 2015-10-06 NOTE — Progress Notes (Signed)
Subjective:   Patient ID: Mary DingwallJessica Laham, female    DOB: 10-Nov-1986, 29 y.o.   MRN: [id number]  Mary DingwallJessica Lafata is a pleasant 29 y.o. year old female who presents to clinic today with Follow-up and Fatigue  on 10/06/2015  HPI:  Was last seen on 10/3- note reviewed.  NO changes made to rxs at that OV.  ? ADD- when I saw her in July, she had just been written up at work  for not completing tasks.  She complained of a hard time concentrating and focusing for at least 6 months duration.  I referred her to psych for formal ADD evaluation.  Evaluation was negative for ADD.  Depression- . When I saw her in 02/2015, she had increased her zoloft to 150 mg daily until 04/2015.  In 05/2015, we added Abilify 2 mg daily as adjuvant therapy.  She feels the Abilify has helped with her mood swings/"anger" tremendously and is very pleased.  Here today because she now feels zoloft is not working.  For the past 1-2 weeks, increased anhedonia, crying spells.  Going to be at 7 pm.  No new stressors at work.  No SI or HI.  Wt Readings from Last 3 Encounters:  10/06/15 143 lb (64.864 kg)  09/13/15 143 lb 4 oz (64.978 kg)  06/30/15 137 lb (62.143 kg)    Current Outpatient Prescriptions on File Prior to Visit  Medication Sig Dispense Refill  . ARIPiprazole (ABILIFY) 2 MG tablet TAKE 1 TABLET (2 MG TOTAL) BY MOUTH DAILY. 30 tablet 5  . Levonorgestrel-Ethinyl Estradiol (AMETHIA,CAMRESE) 0.15-0.03 &0.01 MG tablet Take 1 tablet by mouth daily.  4  . sertraline (ZOLOFT) 100 MG tablet TAKE 1 & 1/2 TABLETS DAILY (Patient not taking: Reported on 10/06/2015) 45 tablet 3   No current facility-administered medications on file prior to visit.    No Known Allergies  Past Medical History  Diagnosis Date  . Vertigo   . 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  Family History  Problem Relation Age of Onset  . Adopted: Yes    Social History   Social History  .  Marital Status: Married    Spouse Name: N/A  . Number of Children: N/A  . Years of Education: N/A   Occupational History  . Not on file.   Social History Main Topics  . Smoking status: Never Smoker   . Smokeless tobacco: Not on file  . Alcohol Use: No  . Drug Use: No  . Sexual Activity: Not Currently   Other Topics Concern  . Not on file   Social History Narrative   ** Merged History Encounter **       Married.   Currently pregnant with first child- due January 2015.   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         The PMH, PSH, Social History, Family History, Medications, and allergies [facility], and have been updated if relevant.   Review of Systems  Respiratory: Negative.   Cardiovascular: Negative.   Neurological: Negative.   Hematological: Negative.   Psychiatric/Behavioral: Positive for sleep disturbance, dysphoric mood and decreased concentration. Negative for suicidal ideas and self-injury. The patient is not nervous/anxious.   All other systems reviewed and are negative.    Wt Readings from Last 3 Encounters:  10/06/15 143 lb (64.864 kg)  09/13/15 143 lb 4 oz (64.978 kg)  06/30/15  137 lb (62.143 kg)    Objective:    BP 120/68 mmHg  Pulse 72  Temp(Src) 98.2 F (36.8 C) (Oral)  Wt 143 lb (64.864 kg)  SpO2 98%  LMP 10/06/2015   Physical Exam  Head: normocephalic and atraumatic.  Eyes: vision grossly intact, pupils equal, pupils round, and pupils reactive to light.  MSK Left arm congential defect Neurologic: alert & oriented X3 and gait normal.  Skin: Intact without suspicious lesions or rashes  Psych: Cognition and judgment appear intact. Alert and cooperative with normal attention span and concentration. No apparent delusions, illusions, hallucinations +tearful       Assessment & Plan:   Depression No Follow-up on file.

## 2015-10-06 NOTE — Progress Notes (Signed)
Pre visit review using our clinic review tool, if applicable. No additional management support is needed unless otherwise documented below in the visit note. 

## 2015-11-12 ENCOUNTER — Other Ambulatory Visit: Payer: Self-pay | Admitting: Family Medicine

## 2015-11-12 MED ORDER — SERTRALINE HCL 100 MG PO TABS: 150.0000 mg | ORAL_TABLET | Freq: Every day | ORAL | Status: DC

## 2015-11-12 NOTE — Addendum Note (Signed)
Addended by: Patience MuscaISLEY, RENA M on: 11/12/2015 03:38 PM   Modules accepted: Orders

## 2015-11-12 NOTE — Telephone Encounter (Signed)
Please call and see how she is doing on med before we refill.

## 2015-11-12 NOTE — Telephone Encounter (Signed)
Lm on pts vm requesting a call back 

## 2015-11-12 NOTE — Telephone Encounter (Signed)
Per 10/26 note pt was to contact office back with update, regarding new med. pls advise

## 2015-11-12 NOTE — Telephone Encounter (Signed)
Medication refilled

## 2015-11-12 NOTE — Telephone Encounter (Signed)
Pt left v/m doing well on wellbutrin and pt request refill of wellbutrin and zoloft; appears from 10/06/15 pt not think zoloft helping and per med list pt was not taking.Please advise. Unable to reach pt to verify if taking zoloft or not.

## 2015-11-12 NOTE — Telephone Encounter (Signed)
Spoke with Pamala Hurry Baity NP and she advised OK to refill zoloft. Refill done as instructed. Pt notified done and voiced understanding.

## 2015-12-06 ENCOUNTER — Other Ambulatory Visit: Payer: Self-pay | Admitting: Family Medicine

## 2015-12-09 ENCOUNTER — Other Ambulatory Visit: Payer: Self-pay | Admitting: Internal Medicine

## 2016-01-10 ENCOUNTER — Other Ambulatory Visit: Payer: Self-pay | Admitting: *Deleted

## 2016-01-10 MED ORDER — BUPROPION HCL ER (XL) 150 MG PO TB24: ORAL_TABLET | ORAL | Status: DC

## 2016-02-10 ENCOUNTER — Ambulatory Visit (INDEPENDENT_AMBULATORY_CARE_PROVIDER_SITE_OTHER): Payer: BLUE CROSS/BLUE SHIELD | Admitting: Family Medicine

## 2016-02-10 ENCOUNTER — Encounter: Payer: Self-pay | Admitting: Family Medicine

## 2016-02-10 VITALS — BP 118/76 | HR 72 | Temp 98.3°F | Wt 144.0 lb

## 2016-02-10 DIAGNOSIS — E559 Vitamin D deficiency, unspecified: Secondary | ICD-10-CM | POA: Diagnosis not present

## 2016-02-10 DIAGNOSIS — R5383 Other fatigue: Secondary | ICD-10-CM | POA: Insufficient documentation

## 2016-02-10 DIAGNOSIS — F32A Depression, unspecified: Secondary | ICD-10-CM

## 2016-02-10 DIAGNOSIS — F329 Major depressive disorder, single episode, unspecified: Secondary | ICD-10-CM

## 2016-02-10 LAB — COMPREHENSIVE METABOLIC PANEL
ALT: 31 U/L (ref 0–35)
AST: 22 U/L (ref 0–37)
Albumin: 4.5 g/dL (ref 3.5–5.2)
Alkaline Phosphatase: 65 U/L (ref 39–117)
BUN: 16 mg/dL (ref 6–23)
CO2: 28 mEq/L (ref 19–32)
Calcium: 9.8 mg/dL (ref 8.4–10.5)
Chloride: 104 mEq/L (ref 96–112)
Creatinine, Ser: 0.61 mg/dL (ref 0.40–1.20)
GFR: 121.69 mL/min (ref >60.00)
Glucose, Bld: 83 mg/dL (ref 70–99)
Potassium: 4 mEq/L (ref 3.5–5.1)
Sodium: 138 mEq/L (ref 135–145)
Total Bilirubin: 0.3 mg/dL (ref 0.2–1.2)
Total Protein: 8.2 g/dL (ref 6.0–8.3)

## 2016-02-10 LAB — CBC WITH DIFFERENTIAL/PLATELET
Basophils Absolute: 0 10*3/uL (ref 0.0–0.1)
Basophils Relative: 0.3 % (ref 0.0–3.0)
Eosinophils Absolute: 0.1 10*3/uL (ref 0.0–0.7)
Eosinophils Relative: 0.8 % (ref 0.0–5.0)
HCT: 38 % (ref 36.0–46.0)
Hemoglobin: 12.8 g/dL (ref 12.0–15.0)
Lymphocytes Relative: 26.2 % (ref 12.0–46.0)
Lymphs Abs: 1.8 10*3/uL (ref 0.7–4.0)
MCHC: 33.6 g/dL (ref 30.0–36.0)
MCV: 85.6 fl (ref 78.0–100.0)
Monocytes Absolute: 0.5 10*3/uL (ref 0.1–1.0)
Monocytes Relative: 7.8 % (ref 3.0–12.0)
Neutro Abs: 4.4 10*3/uL (ref 1.4–7.7)
Neutrophils Relative %: 64.9 % (ref 43.0–77.0)
Platelets: 279 10*3/uL (ref 150.0–400.0)
RBC: 4.44 Mil/uL (ref 3.87–5.11)
RDW: 12.5 % (ref 11.5–15.5)
WBC: 6.7 10*3/uL (ref 4.0–10.5)

## 2016-02-10 LAB — VITAMIN B12: Vitamin B-12: 543 pg/mL (ref 211–911)

## 2016-02-10 LAB — VITAMIN D 25 HYDROXY (VIT D DEFICIENCY, FRACTURES): VITD: 17.77 ng/mL — ABNORMAL LOW (ref 30.00–100.00)

## 2016-02-10 LAB — TSH: TSH: 1.94 u[IU]/mL (ref 0.35–4.50)

## 2016-02-10 NOTE — Assessment & Plan Note (Signed)
New- likely multifactorial but stopping Abilfy likely playing a large part. She will pick up this rx on her way home today. Also check labs today to rule out other contributing factors/deficiencies. The patient indicates understanding of these issues and agrees with the plan.  Orders Placed This Encounter  Procedures  . CBC with Differential/Platelet  . TSH  . Vitamin B12  . Vitamin D, 25-hydroxy  . Comprehensive metabolic panel

## 2016-02-10 NOTE — Progress Notes (Signed)
Subjective:   Patient ID: Mary Andersen, female    DOB: 1986/06/23, 30 y.o.   MRN: [id number]  Mary Andersen is a pleasant 30 y.o. year old female who presents to clinic today with Fatigue  on 02/10/2016  HPI: Feels more fatigued for past two weeks.  Mood and depression had been well controlled on current rxs- Wellbutrin 150 mg XL daily, Zoloft 100 mg daily and Abilify 2 mg daily. Ran out of Abilfy shortly before these symptoms started.  Denies feeling anxious.  Feels she is sleeping well.  On OCPs.  No CP or SOB.  No blood in stool.  Current Outpatient Prescriptions on File Prior to Visit  Medication Sig Dispense Refill  . ARIPiprazole (ABILIFY) 2 MG tablet TAKE 1 TABLET (2 MG TOTAL) BY MOUTH DAILY. 30 tablet 5  . buPROPion (WELLBUTRIN XL) 150 MG 24 hr tablet TAKE 1 TABLET (150 MG TOTAL) BY MOUTH DAILY. 30 tablet 8  . Levonorgestrel-Ethinyl Estradiol (AMETHIA,CAMRESE) 0.15-0.03 &0.01 MG tablet Take 1 tablet by mouth daily.  4  . sertraline (ZOLOFT) 100 MG tablet TAKE 1 & 1/2 TABLETS DAILY 45 tablet 3   No current facility-administered medications on file prior to visit.    No Known Allergies  Past Medical History  Diagnosis Date  . Vertigo   . 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  Family History  Problem Relation Age of Onset  . Adopted: Yes    Social History   Social History  . Marital Status: Married    Spouse Name: N/A  . Number of Children: N/A  . Years of Education: N/A   Occupational History  . Not on file.   Social History Main Topics  . Smoking status: Never Smoker   . Smokeless tobacco: Not on file  . Alcohol Use: No  . Drug Use: No  . Sexual Activity: Not Currently   Other Topics Concern  . Not on file   Social History Narrative   ** Merged History Encounter **       Married.   Currently pregnant with first child- due January 2015.   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         The PMH, PSH, Social History, Family History, Medications, and allergies have been reviewed in [facility], and have been updated if relevant.   Review of Systems  Constitutional: Positive for fatigue.  HENT: Negative.   Eyes: Negative.   Respiratory: Negative.   Cardiovascular: Negative.   Gastrointestinal: Negative.   Endocrine: Negative.   Genitourinary: Negative.   Musculoskeletal: Negative.   Skin: Negative.   Neurological: Negative.   Hematological: Negative.   Psychiatric/Behavioral: Negative.   All other systems reviewed and are negative.      Objective:    BP 118/76 mmHg  Pulse 72  Temp(Src) 98.3 F (36.8 C) (Oral)  Wt 144 lb (65.318 kg)  SpO2 98%  LMP  (Approximate)   Physical Exam  Constitutional: She is oriented to person, place, and time. She appears well-developed and well-nourished. No distress.  HENT:  Head: Normocephalic.  Eyes: Conjunctivae are normal.  Cardiovascular: Normal rate.   Pulmonary/Chest: Effort normal.  Musculoskeletal: Normal range of motion.  Neurological: She is alert and oriented to person, place, and time. No cranial nerve deficit.  Skin: Skin is warm and dry. She is not diaphoretic.  Psychiatric: She has a normal mood and affect. Her behavior is  normal. Judgment and thought content normal.  Nursing note and vitals reviewed.         Assessment & Plan:   Depression  Other fatigue No Follow-up on file.

## 2016-02-10 NOTE — Progress Notes (Signed)
Pre visit review using our clinic review tool, if applicable. No additional management support is needed unless otherwise documented below in the visit note. 

## 2016-02-15 MED ORDER — VITAMIN D (ERGOCALCIFEROL) 1.25 MG (50000 UNIT) PO CAPS: 50000.0000 [IU] | ORAL_CAPSULE | ORAL | Status: DC

## 2016-02-15 NOTE — Addendum Note (Signed)
Addended by: Desmond DikeKNIGHT, Markel Mergenthaler H on: 02/15/2016 11:39 AM   Modules accepted: Orders

## 2016-03-26 ENCOUNTER — Other Ambulatory Visit: Payer: Self-pay | Admitting: Family Medicine

## 2016-04-07 ENCOUNTER — Other Ambulatory Visit: Payer: Self-pay | Admitting: Family Medicine

## 2016-07-19 ENCOUNTER — Inpatient Hospital Stay
Admit: 2016-07-19 | Discharge: 2016-07-24 | DRG: 885 | Disposition: A | Discharge status: Home / Self Care | Payer: BLUE CROSS/BLUE SHIELD | Source: Ambulatory Visit | Attending: Psychiatry | Admitting: Psychiatry

## 2016-07-19 ENCOUNTER — Emergency Department
Admission: EM | Admit: 2016-07-19 | Discharge: 2016-07-19 | Disposition: A | Discharge status: Other Acute Inpatient Hospital | Payer: BLUE CROSS/BLUE SHIELD | Attending: Emergency Medicine | Admitting: Emergency Medicine

## 2016-07-19 ENCOUNTER — Encounter: Payer: Self-pay | Admitting: *Deleted

## 2016-07-19 DIAGNOSIS — R45851 Suicidal ideations: Secondary | ICD-10-CM

## 2016-07-19 DIAGNOSIS — O99345 Other mental disorders complicating the puerperium: Secondary | ICD-10-CM | POA: Diagnosis not present

## 2016-07-19 DIAGNOSIS — F339 Major depressive disorder, recurrent, unspecified: Secondary | ICD-10-CM | POA: Diagnosis present

## 2016-07-19 DIAGNOSIS — F29 Unspecified psychosis not due to a substance or known physiological condition: Secondary | ICD-10-CM | POA: Diagnosis present

## 2016-07-19 DIAGNOSIS — F3160 Bipolar disorder, current episode mixed, unspecified: Secondary | ICD-10-CM | POA: Diagnosis present

## 2016-07-19 DIAGNOSIS — Z79899 Other long term (current) drug therapy: Secondary | ICD-10-CM | POA: Insufficient documentation

## 2016-07-19 DIAGNOSIS — F32A Depression, unspecified: Secondary | ICD-10-CM

## 2016-07-19 DIAGNOSIS — F329 Major depressive disorder, single episode, unspecified: Secondary | ICD-10-CM | POA: Diagnosis present

## 2016-07-19 DIAGNOSIS — G4700 Insomnia, unspecified: Secondary | ICD-10-CM | POA: Diagnosis present

## 2016-07-19 HISTORY — DX: Depression, unspecified: F32.A

## 2016-07-19 HISTORY — DX: Major depressive disorder, single episode, unspecified: F32.9

## 2016-07-19 LAB — URINE DRUG SCREEN, QUALITATIVE (ARMC ONLY)
Amphetamines, Ur Screen: NOT DETECTED
Barbiturates, Ur Screen: NOT DETECTED
Benzodiazepine, Ur Scrn: NOT DETECTED
Cannabinoid 50 Ng, Ur ~~LOC~~: NOT DETECTED
Cocaine Metabolite,Ur ~~LOC~~: NOT DETECTED
MDMA (Ecstasy)Ur Screen: NOT DETECTED
Methadone Scn, Ur: NOT DETECTED
Opiate, Ur Screen: NOT DETECTED
Phencyclidine (PCP) Ur S: NOT DETECTED
Tricyclic, Ur Screen: NOT DETECTED

## 2016-07-19 LAB — ETHANOL: Alcohol, Ethyl (B): <5 mg/dL (ref <5)

## 2016-07-19 LAB — CBC
HCT: 36.7 % (ref 35.0–47.0)
Hemoglobin: 13 g/dL (ref 12.0–16.0)
MCH: 30.1 pg (ref 26.0–34.0)
MCHC: 35.5 g/dL (ref 32.0–36.0)
MCV: 84.6 fL (ref 80.0–100.0)
Platelets: 242 10*3/uL (ref 150–440)
RBC: 4.34 MIL/uL (ref 3.80–5.20)
RDW: 13 % (ref 11.5–14.5)
WBC: 6.8 10*3/uL (ref 3.6–11.0)

## 2016-07-19 LAB — COMPREHENSIVE METABOLIC PANEL
ALT: 15 U/L (ref 14–54)
AST: 22 U/L (ref 15–41)
Albumin: 4.3 g/dL (ref 3.5–5.0)
Alkaline Phosphatase: 65 U/L (ref 38–126)
Anion gap: 8 (ref 5–15)
BUN: 11 mg/dL (ref 6–20)
CO2: 24 mmol/L (ref 22–32)
Calcium: 9.4 mg/dL (ref 8.9–10.3)
Chloride: 104 mmol/L (ref 101–111)
Creatinine, Ser: 0.51 mg/dL (ref 0.44–1.00)
GFR calc Af Amer: >60 mL/min (ref >60)
GFR calc non Af Amer: >60 mL/min (ref >60)
Glucose, Bld: 107 mg/dL — ABNORMAL HIGH (ref 65–99)
Potassium: 3.6 mmol/L (ref 3.5–5.1)
Sodium: 136 mmol/L (ref 135–145)
Total Bilirubin: 0.4 mg/dL (ref 0.3–1.2)
Total Protein: 7.9 g/dL (ref 6.5–8.1)

## 2016-07-19 LAB — LIPID PANEL
Cholesterol: 203 mg/dL — ABNORMAL HIGH (ref 0–200)
HDL: 52 mg/dL (ref >40)
LDL Cholesterol: 136 mg/dL — ABNORMAL HIGH (ref 0–99)
Total CHOL/HDL Ratio: 3.9 RATIO
Triglycerides: 74 mg/dL (ref <150)
VLDL: 15 mg/dL (ref 0–40)

## 2016-07-19 LAB — POCT PREGNANCY, URINE: Preg Test, Ur: NEGATIVE

## 2016-07-19 LAB — TSH: TSH: 1.266 u[IU]/mL (ref 0.350–4.500)

## 2016-07-19 LAB — ACETAMINOPHEN LEVEL: Acetaminophen (Tylenol), Serum: <10 ug/mL — ABNORMAL LOW (ref 10–30)

## 2016-07-19 LAB — SALICYLATE LEVEL: Salicylate Lvl: <4 mg/dL (ref 2.8–30.0)

## 2016-07-19 MED ORDER — BUPROPION HCL ER (XL) 150 MG PO TB24: 150.0000 mg | ORAL_TABLET | Freq: Every day | ORAL | Status: DC

## 2016-07-19 MED ORDER — ARIPIPRAZOLE 5 MG PO TABS
5.0000 mg | ORAL_TABLET | Freq: Every day | ORAL | Status: DC
  Administered 2016-07-19 – 2016-07-21 (×3): 5 mg via ORAL
  Filled 2016-07-19 (×3): qty 1

## 2016-07-19 MED ORDER — ARIPIPRAZOLE 5 MG PO TABS: 5.0000 mg | ORAL_TABLET | Freq: Every day | ORAL | Status: DC

## 2016-07-19 MED ORDER — ACETAMINOPHEN 325 MG PO TABS: 650.0000 mg | ORAL_TABLET | Freq: Four times a day (QID) | ORAL | Status: DC | PRN

## 2016-07-19 MED ORDER — TRAZODONE HCL 100 MG PO TABS
100.0000 mg | ORAL_TABLET | Freq: Every day | ORAL | Status: DC
  Administered 2016-07-19 – 2016-07-23 (×5): 100 mg via ORAL
  Filled 2016-07-19 (×5): qty 1

## 2016-07-19 MED ORDER — ALUM & MAG HYDROXIDE-SIMETH 200-200-20 MG/5ML PO SUSP: 30.0000 mL | ORAL | Status: DC | PRN

## 2016-07-19 MED ORDER — SERTRALINE HCL 100 MG PO TABS: 200.0000 mg | ORAL_TABLET | Freq: Every day | ORAL | Status: DC

## 2016-07-19 MED ORDER — BUPROPION HCL ER (XL) 150 MG PO TB24
150.0000 mg | ORAL_TABLET | Freq: Every day | ORAL | Status: DC
  Administered 2016-07-19 – 2016-07-21 (×3): 150 mg via ORAL
  Filled 2016-07-19 (×3): qty 1

## 2016-07-19 MED ORDER — MAGNESIUM HYDROXIDE 400 MG/5ML PO SUSP: 30.0000 mL | Freq: Every day | ORAL | Status: DC | PRN

## 2016-07-19 MED ORDER — SERTRALINE HCL 100 MG PO TABS
200.0000 mg | ORAL_TABLET | Freq: Every day | ORAL | Status: DC
  Administered 2016-07-19 – 2016-07-24 (×6): 200 mg via ORAL
  Filled 2016-07-19 (×6): qty 2

## 2016-07-19 MED ORDER — HYDROXYZINE HCL 25 MG PO TABS: 50.0000 mg | ORAL_TABLET | Freq: Three times a day (TID) | ORAL | Status: DC | PRN

## 2016-07-19 NOTE — ED Notes (Signed)
[facility]. Patient cooperative with transfer. No personal belongings, husband took them home.

## 2016-07-19 NOTE — Progress Notes (Signed)
Pt being [facility]. Pt [facility] for the charge nurse to review and provide bed assignment.      Cheryl FlashNicole Merit Gadsby, MS, NCC, LPCA Therapeutic Triage Specialist

## 2016-07-19 NOTE — Plan of Care (Signed)
Problem: Activity: Goal: Interest or engagement in activities will improve Outcome: Progressing Patient questioning  The process of what goes on .

## 2016-07-19 NOTE — ED Provider Notes (Signed)
Time Seen: Approximately 1404 I have reviewed the triage notes  Chief Complaint: Depression and Suicidal   History of Present Illness: Mary Andersen is a 30 y.o. female who presents with recent onset of depression with some suicidal statements toward her psychiatrist. Patient does not have any current plan but has been depressed and states that she has had occasional suicidal thoughts. She denies any physical complaints. She denies any homicidal thoughts or hallucinations. She denies any substance abuse. Past Medical History:  Diagnosis Date  . 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 Depression   . Vertigo     Patient Active Problem List   Diagnosis Date Noted  . Major depressive disorder, recurrent, with postpartum onset (HCC) 07/19/2016  . Fatigue 02/10/2016  . Attention and concentration deficit 06/30/2015  . Depression 05/08/2014  . 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 Vertigo 04/29/2012    History reviewed. No pertinent surgical history.  History reviewed. No pertinent surgical history.  Current Outpatient Rx  . Order #: [id number] Class: Normal  . Order #: [id number] Class: Normal  . Order #: [id number] Class: Historical Med  . Order #: [id number] Class: Normal  . Order #: [id number] Class: Normal    Allergies:  Review of patient's allergies indicates no known allergies.  Family History: Family History  Problem Relation Age of Onset  . Adopted: Yes    Social History: Social History  Substance Use Topics  . Smoking status: Never Smoker  . Smokeless tobacco: Never Used  . Alcohol use No     Review of Systems:   10 point review of systems was performed and was otherwise negative:  Constitutional: No fever Eyes: No visual disturbances ENT: No sore throat, ear pain Cardiac: No chest pain Respiratory: No shortness of breath, wheezing, or stridor Abdomen: No abdominal pain, no vomiting, No diarrhea Endocrine: No weight loss,  No night sweats Extremities: Patient has congenital absence of left hand and forearm. Skin: No rashes, easy bruising Neurologic: No focal weakness, trouble with speech or swollowing Urologic: No dysuria, Hematuria, or urinary frequency   Physical Exam:  ED Triage Vitals [07/19/16 1334]  Enc Vitals Group     BP (!) 143/94     Pulse Rate 80     Resp 18     Temp 97.7 F (36.5 C)     Temp Source Oral     SpO2 100 %     Weight 150 lb (68 kg)     Height 5\' 1"  (1.549 m)     Head Circumference      Peak Flow      Pain Score      Pain Loc      Pain Edu?      Excl. in GC?     General: Awake , Alert , and Oriented times 3; GCS 15 Head: Normal cephalic , atraumatic Eyes: Pupils equal , round, reactive to light Nose/Throat: No nasal drainage, patent upper airway without erythema or exudate.  Neck: Supple, Full range of motion, No anterior adenopathy or palpable thyroid masses Lungs: Clear to ascultation without wheezes , rhonchi, or rales Heart: Regular rate, regular rhythm without murmurs , gallops , or rubs Abdomen: Soft, non tender without rebound, guarding , or rigidity; bowel sounds positive and symmetric in all 4 quadrants. No organomegaly .        Extremities: Congenital abnormality cited above otherwise no edema, clubbing, or cyanosis. Neurologic: normal ambulation, Motor symmetric without deficits, sensory intact Skin: warm,  dry, no rashes   Labs:   All laboratory work was reviewed including any pertinent negatives or positives listed below:  Labs Reviewed  COMPREHENSIVE METABOLIC PANEL - Abnormal; Notable for the following:       Result Value   Glucose, Bld 107 (*)    All other components within normal limits  ACETAMINOPHEN LEVEL - Abnormal; Notable for the following:    Acetaminophen (Tylenol), Serum <10 (*)    All other components within normal limits  ETHANOL  SALICYLATE LEVEL  CBC  URINE DRUG SCREEN, QUALITATIVE ([facility] ONLY)  CBC  COMPREHENSIVE METABOLIC PANEL   TSH  POCT PREGNANCY, URINE  POC URINE PREG, ED     ED Course: Patient is here voluntarily at this time. She will be seen by psychiatry in TTS services. She currently has no physical complaints and at this time is medically clear. 1  Clinical Course     Assessment: Suicidal ideation   Plan: Medical clearance for psychiatric evaluation            Jennye Moccasin, MD 07/19/16 1431

## 2016-07-19 NOTE — Progress Notes (Addendum)
[facility] by Dr. Maryruth BunKapur .  Attending Physician will be Dr. Jennet MaduroPucilowska.   Patient [facility] room 309, [facility] Gwen .   Intake Paper Work has been signed and placed on patient chart.  ER staff is aware of the admission Carlisle Beers( [facility] Sect.; ER MD; Amy Patient's Nurse & Dorothy Patient Access).  07/19/2016 Cheryl FlashNicole Dula Havlik, MS, NCC, LPCA Therapeutic Triage Specialist

## 2016-07-19 NOTE — Consult Note (Signed)
[facility] Face-to-Face Psychiatry Consult   Reason for Consult:  Oswaldo Conroy, Md Referring Physician:  Caryn Section, MD Patient Identification: Synethia Endicott MRN:  [id number] Principal Diagnosis: Major Depression with Post-Partum Psychosis  Diagnosis:   Patient Active Problem List   Diagnosis Date Noted  . Fatigue [R53.83] 02/10/2016  . Attention and concentration deficit [R41.840] 06/30/2015  . Depression [F32.9] 05/08/2014  . 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 Vertigo [R42] 04/29/2012    Total Time spent with patient: 1 hour  Subjective:   Ms. Kallen is a 30 year old married Caucasian female history recurrent depression worsened postpartum when he was born approximately 2-1/2 years. The patient presents for psychotropic medication management for depression and endorsed suicidal thoughts with an inability to contract for safety. She says she has been having intense thoughts want to crash her car working herself but has not actually acted on these thoughts. The patient reports problems with increased outcome in a gallium energy level that has progressively gotten worse since the birth of her daughter. She does report sleeping excessively and wanting to get to bed at about 7:30 PM when she gets home from work. She denies any history of any prior inpatient psychiatric hospitalizations or suicide attempts but does not trust herself to go home today. She does report high levels of anxiety that she feels very overwhelmed with having to go anywhere with her daughter. She says that there is a lot of things to pack and she tries to get out of going anywhere if she can. She does report some problems with irritability but no major anger outbursts. She has been on Zoloft for about 5-6 years but then last year was started on Wellbutrin and Abilify by her PCP to help with depressive symptoms. She has not had any psychogenic medication changes in approximately one year's time. She  has been seeing an individual therapist, Chyrel Masson for counseling over the past 2-3 months. She denies any history of symptoms consistent with bipolar meaningfully racing thoughts, very distal lesions, decreased sleep with increased goal directed behavior, hyperreligious thoughts or hypersexual behavior. The patient denies any significant irritability goal-directed behavior, religious thoughts or hypersexual behavior. She denies any history of any psychosis including auditory or visual hallucinations. She denies any paranoid thoughts or delusions. She denies any history of any alcohol use or illicit drug use. He denies any marital conflict but says that she and her husband have been struggling financially. She was unable to contract for safety and Dr. Shelda Altes office and voluntarily wanted inpatient psychiatric hospitalization.  Past Psychiatric History The patient denies any prior inpatient psychiatric hospitalizations but has had intense suicidal thoughts or the past one week. She has been on Zoloft since her early 3s and in 2016 started Abilify and Wellbutrin. She has been seeing a therapist, Chyrel Masson, for the past 3 months  PMH Congenital Absence of of Left forearm She denies any history of any prior TBI or seizures  Social history The patient did not know her biological parents. She was was adopted and does not know her biological parents. Her adopted father died 3 years ago. She had a very close relationship with both parents and denies any history of any physical or sexual abuse. 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 She has been married for 4 years and has one 46-year-old daughter and then one stepson age 65. Her stepson spends  half her time with her and her husband and half of the time with his biological mother. Her husband works as a Investment banker, operational. She denies any marital conflict.  Family Psychiatric  History The patient denies any history of any mental illness or substance use and family.  Substance Abuse History She denies any heavy alcohol use or illicit drug use.  Legal History She denies any prior arrests or incarceration   Risk to Self: Is patient at risk for suicide?: Yes Risk to Others:   No  Prior Inpatient Therapy:  NO Prior Outpatient Therapy:   Not from psychiatrist, only PCP  Past Medical History:  Past Medical History:  Diagnosis Date  . 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 Depression   . Vertigo    History reviewed. No pertinent surgical history. Family History:  Family History  Problem Relation Age of Onset  . Adopted: Yes    Social History:  History  Alcohol Use No     History  Drug Use No    Social History   Social History  . Marital status: Married    Spouse name: N/A  . Number of children: N/A  . Years of education: N/A   Social History Main Topics  . Smoking status: Never Smoker  . Smokeless tobacco: Never Used  . Alcohol use No  . Drug use: No  . Sexual activity: Not Currently   Other Topics Concern  . None   Social History Narrative   The patient did not know her biological parents. She was was adopted and does not know her biological parents. Her adopted father died 3 years ago. She had a very close relationship with both parents and denies any history of any physical or sexual abuse. 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 She has been married for 4 years and has one 56-year-old daughter and then one stepson age 49. Her stepson spends half her time with her and her husband and half of the time with his biological mother. Her husband works as a Investment banker, operational. She denies any marital conflict.        :    Allergies:  No Known Allergies  Labs:  Results for orders placed or performed during the hospital encounter of 07/19/16 (from the past 48  hour(s))  cbc     Status: None   Collection Time: 07/19/16  1:38 PM  Result Value Ref Range   WBC 6.8 3.6 - 11.0 K/uL   RBC 4.34 3.80 - 5.20 MIL/uL   Hemoglobin 13.0 12.0 - 16.0 g/dL   HCT 16.1 09.6 - 04.5 %   MCV 84.6 80.0 - 100.0 fL   MCH 30.1 26.0 - 34.0 pg   MCHC 35.5 32.0 - 36.0 g/dL   RDW 40.9 81.1 - 91.4 %   Platelets 242 150 - 440 K/uL  Pregnancy, urine POC     Status: None   Collection Time: 07/19/16  1:56 PM  Result Value Ref Range   Preg Test, Ur NEGATIVE NEGATIVE    Comment:        THE SENSITIVITY OF THIS METHODOLOGY IS >24 mIU/mL     No current facility-administered medications for this encounter.    Current Outpatient Prescriptions  Medication Sig Dispense Refill  . ARIPiprazole (ABILIFY) 2 MG tablet TAKE 1 TABLET (2 MG TOTAL) BY MOUTH DAILY. 30 tablet 5  . buPROPion (WELLBUTRIN XL) 150  MG 24 hr tablet TAKE 1 TABLET (150 MG TOTAL) BY MOUTH DAILY. 30 tablet 8  . Levonorgestrel-Ethinyl Estradiol (AMETHIA,CAMRESE) 0.15-0.03 &0.01 MG tablet Take 1 tablet by mouth daily.  4  . sertraline (ZOLOFT) 100 MG tablet TAKE 1 & 1/2 TABLETS DAILY 45 tablet 3  . Vitamin D, Ergocalciferol, (DRISDOL) 50000 units CAPS capsule Take 1 capsule (50,000 Units total) by mouth every 7 (seven) days. 6 capsule 0    Musculoskeletal: Strength & Muscle Tone: within normal limits Gait & Station: normal Patient leans: N/A  Psychiatric Specialty Exam: Physical Exam  Review of Systems  Constitutional: Negative.  Negative for chills, diaphoresis, fever, malaise/fatigue and weight loss.  HENT: Negative.  Negative for ear discharge, ear pain, hearing loss, sore throat and tinnitus.   Eyes: Negative.  Negative for blurred vision, double vision, photophobia and pain.  Respiratory: Negative for cough, hemoptysis, shortness of breath and wheezing.   Cardiovascular: Negative for chest pain, palpitations and orthopnea.  Gastrointestinal: Negative.  Negative for abdominal pain, constipation,  diarrhea, heartburn, nausea and vomiting.  Genitourinary: Negative.  Negative for dysuria, frequency and urgency.  Musculoskeletal: Negative for back pain, falls, myalgias and neck pain.       Congenital loss of left forearm  Skin: Negative.  Negative for itching and rash.  Neurological: Negative.  Negative for dizziness, tingling, tremors, sensory change, focal weakness, seizures, loss of consciousness and headaches.  Endo/Heme/Allergies: Negative.  Negative for environmental allergies. Does not bruise/bleed easily.    Blood pressure (!) 143/94, pulse 80, temperature 97.7 F (36.5 C), temperature source Oral, resp. rate 18, height 5\' 1"  (1.549 m), weight 68 kg (150 lb), last menstrual period 07/07/2016, SpO2 100 %.Body mass index is 28.34 kg/m.  General Appearance: Casual  Eye Contact:  Good  Speech:  Clear and Coherent and Normal Rate  Volume:  Normal  Mood:  Depressed  Affect:  Depressed and Tearful  Thought Process:  Coherent, Goal Directed and Linear  Orientation:  Full (Time, Place, and Person)  Thought Content:  Logical  Suicidal Thoughts:  Yes.  with intent/plan  Homicidal Thoughts:  No  Memory:  Immediate;   Good Recent;   Good Remote;   Good  Judgement:  Good  Insight:  Good  Psychomotor Activity:  Normal  Concentration:  Concentration: Good and Attention Span: Good  Recall:  Good  Fund of Knowledge:  Good  Language:  Good  Akathisia:  No  Handed:  Right  AIMS (if indicated):     Assets:  Communication Skills Desire for Improvement Financial Resources/Insurance Housing Physical Health  ADL's:  Intact  Cognition:  WNL  Sleep:        Treatment Plan Summary:   DIAGNOSIS: Major depressive disorder with postpartum onset Congenital loss of left forearm Mild-to-moderate: financial problems, taking care of young child   [name] is a 30 y.o married Caucasian female with a history of recurrent major depression with worsening postpartum onset approximately 2  years. The patient denies any current psychotic symptoms or history of symptoms consistent with bipolar mania but has been having intense suicidal thoughts in the past one week with a plan to either crash her car or having herself. She says she came close to both. She was unable to contract for safety outside of the hospital. At this time, she will be admitted to inpatient psychiatry for medication management, safety, and stabilization..  Major depressive disorder with postpartum onset: Will plan to increase Abilify 20 mg by mouth daily to 5  mg by mouth daily. We'll plan to increase Zoloft to a total of 200 mg by mouth daily for anxiety and depression and continue Wellbutrin XL 150 mg by mouth daily. We'll check hemoglobin A1c, lipid panel and prolactin level given that she is on Abilify.  Hospitalization Status: Voluntary  She will be placed on suicide precautions in the ER.   Disposition: The patient has a stable living situation and will follow for outpatient psychotropic medication management with Dr. every 4 after discharge. She also will follow up with individual therapist Pat Patrick   Daily contact with patient to assess and evaluate symptoms and progress in treatment and Medication management  Disposition: Recommend psychiatric Inpatient admission when medically cleared.  Levora Angel, MD 07/19/2016 2:04 PM

## 2016-07-19 NOTE — ED Notes (Signed)

## 2016-07-19 NOTE — ED Notes (Signed)
Report called to Carmin MuskratAmy Harris RN. [facility]

## 2016-07-19 NOTE — Progress Notes (Signed)
S: 30 year of white female in under the service of Dr. Jennet MaduroPucilowska . Patient had a baby 2.5 years ago and has had post par dum   Depression  Since . Patient has thoughts of suicide. With a plan to run car into a tree or cut herself .  B:Left arm not fully developed Patient  Has finical difficulties . Patient has been working  For 7 years at a [employer]  Patient does not drink or smoke  A: Low falls,  Good eye contact ,no problems with gait  Appropriate Hygeine  R: Search with no contraband found  Skin intact no breaks

## 2016-07-19 NOTE — Plan of Care (Signed)
Problem: Education: Goal: Emotional status will improve Outcome: Not Progressing Tearful at present

## 2016-07-19 NOTE — ED Triage Notes (Signed)
Pt arrives to ER voluntarily with Dr. Narda AmberKapaur from her office c/o depression and suicidal thoughts. Pt thought about crashing her car X 1 week ago. Denies SI plan right now but does have thoughts. Pt tearful. Denies HI.

## 2016-07-19 NOTE — Tx Team (Signed)
Initial Interdisciplinary Treatment Plan   PATIENT STRESSORS: Financial difficulties Occupational concerns   PATIENT STRENGTHS: Active sense of humor Average or above average intelligence Communication skills Supportive family/friends   PROBLEM LIST: Problem List/Patient Goals Date to be addressed Date deferred Reason deferred Estimated date of resolution  Suicidal 07/19/16     Depression  07/19/16                                                DISCHARGE CRITERIA:  Ability to meet basic life and health needs Improved stabilization in mood, thinking, and/or behavior  PRELIMINARY DISCHARGE PLAN: Outpatient therapy Return to previous living arrangement Return to previous work or school arrangements  PATIENT/FAMIILY INVOLVEMENT: This treatment plan has been presented to and reviewed with the patient, Mary Andersen, and/or family member, .  The patient and family have been given the opportunity to ask questions and make suggestions.  Mata Rowen A Vahan Wadsworth 07/19/2016, 6:18 PM

## 2016-07-20 DIAGNOSIS — F339 Major depressive disorder, recurrent, unspecified: Secondary | ICD-10-CM

## 2016-07-20 DIAGNOSIS — O99345 Other mental disorders complicating the puerperium: Secondary | ICD-10-CM

## 2016-07-20 LAB — TSH: TSH: 0.975 u[IU]/mL (ref 0.350–4.500)

## 2016-07-20 LAB — HEMOGLOBIN A1C: Hgb A1c MFr Bld: 5 % (ref 4.0–6.0)

## 2016-07-20 NOTE — Progress Notes (Signed)
Recreation Therapy Notes  Date: 08.10.17 Time: 1:15 pm  Location: Craft Room  Group Topic: Leisure Education  Goal Area(s) Addresses:  Patient will identify activities for each letter of the alphabet. Patient will verbalize ability to integrate positive leisure into life post d/c. Patient will verbalize ability to use leisure as a Associate Professorcoping skill.  Behavioral Response: Attentive, Interactive  Intervention: Leisure Alphabet  Activity: Patients were given a Leisure Information systems managerAlphabet worksheet and instructed to write healthy leisure activities for each letter of the alphabet.  Education: LRT educated patients on what they need to participate in leisure.  Education Outcome: Acknowledges education/In group clarification offered  Clinical Observations/Feedback: Patient completed activity by writing healthy leisure activities. Patient contributed to group discussion by stating some healthy leisure activities, and what she needs to participate in leisure.  [name],Megumi Treaster M, LRT/CTRS 07/20/2016 3:38 PM

## 2016-07-20 NOTE — Progress Notes (Signed)
No distress, no discomfort, safety maintained.

## 2016-07-20 NOTE — Progress Notes (Signed)
D: Patient pleasant and cooperative with care. Denies SI, HI, AVH. Endorses depression and anxiety at 7, hopelessness at 6. Pt goal to get on right medication.  A: Encouragement and support offered. Safety checks maintained. R: Pt receptive and remains safe on unit with q 15 min checks.

## 2016-07-20 NOTE — [facility] Suicide Risk Assessment (Signed)
[facility] INPATIENT:  Family/Significant Other Suicide Prevention Education  Suicide Prevention Education:  Education Completed; Lorina RabonDonald Wiechmann (husband) [phone], has been identified by the patient as the family member/significant other with whom the patient will be residing, and identified as the person(s) who will aid the patient in the event of a mental health crisis (suicidal ideations/suicide attempt).  With written consent from the patient, the family member/significant other has been provided the following suicide prevention education, prior to the and/or following the discharge of the patient.  The suicide prevention education provided includes the following:  Suicide risk factors  Suicide prevention and interventions  National Suicide Hotline telephone number  [facility] assessment telephone number  [facility] Emergency Assistance 911  [facility] and/or Residential Mobile Crisis Unit telephone number  Request made of family/significant other to:  Remove weapons (e.g., guns, rifles, knives), all items previously/currently identified as safety concern.    Remove drugs/medications (over-the-counter, prescriptions, illicit drugs), all items previously/currently identified as a safety concern.  The family member/significant other verbalizes understanding of the suicide prevention education information provided.The family member/significant other agrees to remove the items of safety concern listed above. Family member expressed concerns about patients impulse behavior. 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 Family member wants CSW to inform doctor of patients behaviors. Family had no further questions or concerns for CSW at this time.   Darnice Comrie G. Garnette CzechSampson MSW, LCSWA 07/20/2016, 1:17 PM

## 2016-07-20 NOTE — Progress Notes (Signed)
D: Observed pt visiting with husband. Patient alert and oriented x4. Patient denies SI/HI/AVH. Pt affect anxious and depressed. Pt rated depression 9/10 and anxiety 8/10. Pt talked about her depression increasing over the past 2 years, and being especially worse over the past year. Pt indicated that her husband is very supportive. Pt stated her day "sucked...don't want to be here."  A: Offered active listening and support. Provided therapeutic communication. Administered scheduled medications. Educated pt on discharge planning, and encouraged pt to discuss concerns with Child psychotherapistsocial worker and doctor. Encouraged pt to attend groups and actively participate in care.  R: Pt pleasant and cooperative. Pt medication compliant. Will continue Q15 min. checks. Safety maintained.

## 2016-07-20 NOTE — Progress Notes (Signed)
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; finances)  Coping Skills:   Isolate, Arguments, Avoidance, Exercise, Art/Dance, Music, Sports, Other (Comment) (Spend time with daughter)  Personal Challenges: Anger, Concentration, Expressing Yourself, Relationships, Self-Esteem/Confidence, Social Interaction, Stress Management, Trusting Others, Work Nutritional therapisterformance  Leisure Interests (2+):  Music - Listen, Individual - Other (Comment) (Ride in her convertible)  Awareness of Community Resources:  No  Community Resources:     Current Use:    If no, Barriers?:    Patient Strengths:  Probably not  Patient Identified Areas of Improvement:  Self-esteem, depression  Current Recreation Participation:  Nothing  Patient Goal for Hospitalization:  To get on the right medications  [place]idence[place]  [place]   Current SI (including self-harm):  No  Current HI:  No  Consent to Intern Participation: N/A   [name],[name], LRT/CTRS 07/20/2016, 3:58 PM

## 2016-07-20 NOTE — Plan of Care (Signed)
Problem: Safety: Goal: Ability to remain free from injury will improve Outcome: Progressing Pt has remained free from harm this shift.

## 2016-07-20 NOTE — H&P (Addendum)
Psychiatric Admission Assessment Adult  Patient Identification: Mary DingwallJessica Andersen MRN:  [id number] Date of Evaluation:  07/20/2016 Chief Complaint:  Major Depression With Post Partum Psychosis Principal Diagnosis: <principal problem not specified> Diagnosis:   Patient Active Problem List   Diagnosis Date Noted  . Major depressive disorder, recurrent, with postpartum onset (HCC) [W09.811[O99.345, F33.9] 07/19/2016  . Suicidal ideation [R45.851] 07/19/2016  . Fatigue [R53.83] 02/10/2016  . Attention and concentration deficit [R41.840] 06/30/2015  . Depression [F32.9] 05/08/2014  . 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 Vertigo [R42] 04/29/2012   History of Present Illness:   Identifying data. [name] is a 30 year old married Caucasian female history recurrent depression.  Chief complaint. "I have suicidal thoughts."  History of present illness. Information was obtained from the patient and the chart. The patient has a history of depression that worsened postpartum 2-1/2 years ago. The patient presented at Dr. Shelda AltesKapur's office for the first time for psychotropic medication management for depression. She endorsed suicidal thoughts with inability to contract for safety. She reported intense thoughts of wanting to crash her car. She actually acted on these thoughts one week prior. The patient reports problems with depression that has been progressively worse since the birth of her daughter. She does reports excessive sleep and appetite, poor energy and concentration, feeling of guilt and hopelessness and worthlessness, social isolation crying spells that culminated in suicidal thinking. She also reports irritability, hyperactivity, and poor impulse control at times. Her mother reported that the patient confirmed that she has been spending money excessively. She also had an affair recently. Following the affair, the patient and her husband started couples therapy. She denies  psychotic symptoms. She denies symptoms suggestive of bipolar mania but does report mood instability and has been "all over the place recently" sometimes feeling suicidal sometimes hyper. She reports heightened anxiety but no symptoms of PTSD or OCD. She denies alcohol or illicit substance use.  Past Psychiatric History. She denies any history of any prior inpatient psychiatric hospitalizations or suicide attempts. She has been on Zoloft for about 5-6 years prescribed by her PCP. Recently Wellbutrin and Abilify were to her regimen with minimal improvement. She has been seeing a therapist, [name] for counseling over the past 2-3 months.   PMH Congenital Absence of of Left forearm She denies any history of any prior TBI or seizures  Social history The patient was adopted and does not know her biological parents. Her adopted father died 3 years ago. She had a very close relationship with both parents and denies any history of any physical or sexual abuse. 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 She has been married for 4 years and has one 10774-year-old daughter and then one stepson age 31. Her stepson spends half her time with her and her husband and half of the time with his biological mother. Her husband works as a Investment banker, operationalchef. She denies any marital conflict.  Family Psychiatric History The patient was adopted from [place] at the age of 516 months and does not know her family history.   Substance Abuse History She denies any heavy alcohol use or illicit drug use.  Legal History She denies any prior arrests or incarceration  Total Time spent with patient: 1 hour  Is the patient at risk to self? Yes.    Has the patient been a risk to self in the past 6 months? No.  Has the patient been a risk to self within the distant past? No.  Is the patient a risk to others? No.  Has the patient been a risk to others  in the past 6 months? No.  Has the patient been a risk to others within the distant past? No.   Prior Inpatient Therapy:   Prior Outpatient Therapy:    Alcohol Screening: 1. How often do you have a drink containing alcohol?: Never 2. How many drinks containing alcohol do you have on a typical day when you are drinking?: 1 or 2 3. How often do you have six or more drinks on one occasion?: Never Preliminary Score: 0 4. How often during the last year have you found that you were not able to stop drinking once you had started?: Never 5. How often during the last year have you failed to do what was normally expected from you becasue of drinking?: Never 6. How often during the last year have you needed a first drink in the morning to get yourself going after a heavy drinking session?: Never 7. How often during the last year have you had a feeling of guilt of remorse after drinking?: Never 8. How often during the last year have you been unable to remember what happened the night before because you had been drinking?: Never 9. Have you or someone else been injured as a result of your drinking?: No 10. Has a relative or friend or a doctor or another health worker been concerned about your drinking or suggested you cut down?: No Alcohol Use Disorder Identification Test Final Score (AUDIT): 0 Brief Intervention: AUDIT score less than 7 or less-screening does not suggest unhealthy drinking-brief intervention not indicated Substance Abuse History in the last 12 months:  No. Consequences of Substance Abuse: NA Previous Psychotropic Medications: Yes  Psychological Evaluations: No  Past Medical History:  Past Medical History:  Diagnosis Date  . 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 Depression   . Vertigo    History reviewed. No pertinent surgical history. Family History:  Family History  Problem Relation Age of Onset  . Adopted: Yes   Tobacco Screening: Have you used any form of  tobacco in the last 30 days? (Cigarettes, Smokeless Tobacco, Cigars, and/or Pipes): No Social History:  History  Alcohol Use No     History  Drug Use No    Additional Social History:                           Allergies:  No Known Allergies Lab Results:  Results for orders placed or performed during the hospital encounter of 07/19/16 (from the past 48 hour(s))  Lipid panel, fasting     Status: Abnormal   Collection Time: 07/19/16  1:38 PM  Result Value Ref Range   Cholesterol 203 (H) 0 - 200 mg/dL   Triglycerides 74 <161 mg/dL   HDL 52 >09 mg/dL   Total CHOL/HDL Ratio 3.9 RATIO   VLDL 15 0 - 40 mg/dL   LDL Cholesterol 604 (H) 0 - 99 mg/dL    Comment:        Total Cholesterol/HDL:CHD Risk Coronary Heart Disease Risk Table                     Men   Women  1/2 Average Risk   3.4   3.3  Average Risk       5.0  4.4  2 X Average Risk   9.6   7.1  3 X Average Risk  23.4   11.0        Use the calculated Patient Ratio above and the CHD Risk Table to determine the patient's CHD Risk.        ATP III CLASSIFICATION (LDL):  <100     mg/dL   Optimal  782-956  mg/dL   Near or Above                    Optimal  130-159  mg/dL   Borderline  213-086  mg/dL   High  >578     mg/dL   Very High   TSH     Status: None   Collection Time: 07/19/16  1:38 PM  Result Value Ref Range   TSH 1.266 0.350 - 4.500 uIU/mL    Blood Alcohol level:  Lab Results  Component Value Date   ETH <5 07/19/2016    Metabolic Disorder Labs:  Lab Results  Component Value Date   HGBA1C 5.1 05/31/2015   No results found for: PROLACTIN Lab Results  Component Value Date   CHOL 203 (H) 07/19/2016   TRIG 74 07/19/2016   HDL 52 07/19/2016   CHOLHDL 3.9 07/19/2016   VLDL 15 07/19/2016   LDLCALC 136 (H) 07/19/2016   LDLCALC 112 (H) 05/31/2015    Current Medications: Current Facility-Administered Medications  Medication Dose Route Frequency Provider Last Rate Last Dose  . acetaminophen  (TYLENOL) tablet 650 mg  650 mg Oral Q6H PRN Darliss Ridgel, MD      . alum & mag hydroxide-simeth (MAALOX/MYLANTA) 200-200-20 MG/5ML suspension 30 mL  30 mL Oral Q4H PRN Darliss Ridgel, MD      . ARIPiprazole (ABILIFY) tablet 5 mg  5 mg Oral Daily Shari Prows, MD   5 mg at 07/19/16 2138  . buPROPion (WELLBUTRIN XL) 24 hr tablet 150 mg  150 mg Oral Daily Shari Prows, MD   150 mg at 07/19/16 2138  . magnesium hydroxide (MILK OF MAGNESIA) suspension 30 mL  30 mL Oral Daily PRN Darliss Ridgel, MD      . sertraline (ZOLOFT) tablet 200 mg  200 mg Oral Daily Shari Prows, MD   200 mg at 07/19/16 2138  . traZODone (DESYREL) tablet 100 mg  100 mg Oral QHS Shari Prows, MD   100 mg at 07/19/16 2138   PTA Medications: Prescriptions Prior to Admission  Medication Sig Dispense Refill Last Dose  . ARIPiprazole (ABILIFY) 2 MG tablet Take 2 mg by mouth daily.   07/18/2016 at Unknown time  . buPROPion (WELLBUTRIN XL) 150 MG 24 hr tablet Take 150 mg by mouth daily.   07/18/2016 at Unknown time  . Levonorgestrel-Ethinyl Estradiol (AMETHIA,CAMRESE) 0.15-0.03 &0.01 MG tablet Take 1 tablet by mouth daily.  4 07/18/2016 at Unknown time  . sertraline (ZOLOFT) 100 MG tablet Take 100-150 mg by mouth daily.   07/18/2016 at Unknown time  . Vitamin D, Ergocalciferol, (DRISDOL) 50000 units CAPS capsule Take 1 capsule (50,000 Units total) by mouth every 7 (seven) days. (Patient not taking: Reported on 07/19/2016) 6 capsule 0 Not Taking at Unknown time    Musculoskeletal: Strength & Muscle Tone: within normal limits Gait & Station: normal Patient leans: N/A  Psychiatric Specialty Exam: I reviewed physical exam performed in the emergency room and agree with the findings.. Physical Exam  Nursing note and vitals reviewed.  Review of Systems  Psychiatric/Behavioral: Positive for depression and suicidal ideas.  All other systems reviewed and are negative.   Blood pressure 127/80, pulse 74,  temperature 98.4 F (36.9 C), temperature source Oral, resp. rate 16, height 5\' 1"  (1.549 m), weight 67.6 kg (149 lb), last menstrual period 07/07/2016, SpO2 98 %.Body mass index is 28.15 kg/m.  See SRA.                                                  Sleep:  Number of Hours: 7.75       Treatment Plan Summary: Daily contact with patient to assess and evaluate symptoms and progress in treatment and Medication management   [name] is a 30 year old female with a history of depression admitted for worsening of her symptoms and aborted suicide attempt.  1. Suicidal ideation. The patient is able to contract for safety in the hospital.  2. Mood. The patient was maintained on a combination of Abilify, Zoloft, and Wellbutrin. We continued Wellbutrin and increase Abilify to 5 mg and Zoloft 200 mg.  3. Metabolic syndrome monitoring. Lipid profile, TSH, hemoglobin A1c, and prolactin are pending.  4. Insomnia. Trazodone is available.  5. Disposition. She will return to home with her husband. She will follow up with Dr. Maryruth Bun for medication management and her therapist.  Observation Level/Precautions:  15 minute checks  Laboratory:  CBC Chemistry Profile UDS UA  Psychotherapy:    Medications:    Consultations:    Discharge Concerns:    Estimated LOS:  Other:     I certify that inpatient services furnished can reasonably be expected to improve the patient's condition.    Kristine Linea, MD 8/10/20178:14 AM

## 2016-07-20 NOTE — Plan of Care (Signed)
Problem: Medication: Goal: Compliance with prescribed medication regimen will improve Outcome: Progressing Patient is med compliant   

## 2016-07-20 NOTE — [facility] Group Notes (Signed)
Goals Group  Date/Time:  Type of Therapy and Topic: Group Therapy: Goals Group: SMART Goals  ?  Participation Level: Moderate  ?  Description of Group:  ?  The purpose of a daily goals group is to assist and guide patients in setting recovery/wellness-related goals. The objective is to set goals as they relate to the crisis in which they were admitted. Patients will be using SMART goal modalities to set measurable goals. Characteristics of realistic goals will be discussed and patients will be assisted in setting and processing how one will reach their goal. Facilitator will also assist patients in applying interventions and coping skills learned in psycho-education groups to the SMART goal and process how one will achieve defined goal.  ?  Therapeutic Goals:  ?  -Patients will develop and document one goal related to or their crisis in which brought them into treatment.  -Patients will be guided by LCSW using SMART goal setting modality in how to set a measurable, attainable, realistic and time sensitive goal.  -Patients will process barriers in reaching goal.  -Patients will process interventions in how to overcome and successful in reaching goal.  ?  Patient's Goal: Pt's goal today is to attend all groups and actively participate. ?  Therapeutic Modalities:  Motivational Interviewing  Engineer, manufacturing systemsCognitive Behavioral Therapy  Crisis Intervention Model  SMART goals setting

## 2016-07-20 NOTE — B[facility] Group Notes (Signed)
BHH LCSW Group Therapy  07/20/2016 10:27 AM  Type of Therapy:  Group Therapy  Participation Level:  Active  Participation Quality:  Appropriate  Affect:  Appropriate  Cognitive:  Alert  Insight:  Engaged  Engagement in Therapy:  Engaged  Modes of Intervention:  Activity, Discussion, Education and Support  Summary of Progress/Problems: Balance in life: Patients will discuss the concept of balance and how it looks and feels to be unbalanced. Pt will identify areas in their life that is unbalanced and ways to become more balanced. Pt talked about her depression and how she has lack of energy due to her depression. CSW provided support and talked about the areas pt could make improvements to have more overall balance.    Colen Eltzroth G. Garnette CzechSampson MSW, LCSWA 07/20/2016, 10:34 AM

## 2016-07-20 NOTE — [facility] Suicide Risk Assessment (Signed)
[facility] Admission Suicide Risk Assessment   Nursing information obtained from:    Demographic factors:    Current Mental Status:    Loss Factors:    Historical Factors:    Risk Reduction Factors:     Total Time spent with patient: 1 hour Principal Problem: <principal problem not specified> Diagnosis:   Patient Active Problem List   Diagnosis Date Noted  . Major depressive disorder, recurrent, with postpartum onset (HCC) [Y40.347, F33.9] 07/19/2016  . Suicidal ideation [R45.851] 07/19/2016  . Fatigue [R53.83] 02/10/2016  . Attention and concentration deficit [R41.840] 06/30/2015  . Depression [F32.9] 05/08/2014  . 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 Vertigo [R42] 04/29/2012   Subjective Data: Depression, suicidal ideation.  Continued Clinical Symptoms:  Alcohol Use Disorder Identification Test Final Score (AUDIT): 0 The "Alcohol Use Disorders Identification Test", Guidelines for Use in Primary Care, Second Edition.  World Science writer [facility]). Score between 0-7:  no or low risk or alcohol related problems. Score between 8-15:  moderate risk of alcohol related problems. Score between 16-19:  high risk of alcohol related problems. Score 20 or above:  warrants further diagnostic evaluation for alcohol dependence and treatment.   CLINICAL FACTORS:   Depression:   Impulsivity   Musculoskeletal: Strength & Muscle Tone: within normal limits Gait & Station: normal Patient leans: N/A  Psychiatric Specialty Exam: Physical Exam  Nursing note and vitals reviewed.   Review of Systems  Psychiatric/Behavioral: Positive for depression and suicidal ideas.  All other systems reviewed and are negative.   Blood pressure 127/80, pulse 74, temperature 98.4 F (36.9 C), temperature source Oral, resp. rate 16, height  (1.549 m), weight 67.6 kg (149 lb), last menstrual period 07/07/2016, SpO2 98 %.Body mass index is 28.15 kg/m.  General Appearance:  Casual  Eye Contact:  Good  Speech:  Clear and Coherent  Volume:  Normal  Mood:  Depressed  Affect:  Congruent  Thought Process:  Goal Directed  Orientation:  Full (Time, Place, and Person)  Thought Content:  WDL  Suicidal Thoughts:  Yes.  with intent/plan  Homicidal Thoughts:  No  Memory:  Immediate;   Fair Recent;   Fair Remote;   Fair  Judgement:  Impaired  Insight:  Shallow  Psychomotor Activity:  Normal  Concentration:  Concentration: Fair and Attention Span: Fair  Recall:  [employer] of Knowledge:  Fair  Language:  Fair  Akathisia:  No  Handed:  Right  AIMS (if indicated):     Assets:  Communication Skills Desire for Improvement Financial Resources/Insurance Housing Physical Health Resilience Social Support Transportation Vocational/Educational  ADL's:  Intact  Cognition:  WNL  Sleep:  Number of Hours: 7.75      COGNITIVE FEATURES THAT CONTRIBUTE TO RISK:  None    SUICIDE RISK:   Moderate:  Frequent suicidal ideation with limited intensity, and duration, some specificity in terms of plans, no associated intent, good self-control, limited dysphoria/symptomatology, some risk factors present, and identifiable protective factors, including available and accessible social support.   PLAN OF CARE: Hospital admission, medication management, discharge planning.  [name] is a 30 year old female with a history of depression admitted for worsening of her symptoms and aborted suicide attempt.  1. Suicidal ideation. The patient is able to contract for safety in the hospital.  2. Mood. The patient was maintained on a combination of Abilify, Zoloft, and Wellbutrin. We continued Wellbutrin and increase Abilify to 5 mg and Zoloft 200 mg.  3.  Metabolic syndrome monitoring. Lipid profile, TSH, hemoglobin A1c, and prolactin are pending.  4. Insomnia. Trazodone is available.  5. Disposition. She will return to home with her husband. She will follow up with Dr. Maryruth BunKapur  for medication management and her therapist.  I certify that inpatient services furnished can reasonably be expected to improve the patient's condition.  Kristine LineaJolanta Jori Frerichs, MD 07/20/2016, 8:09 AM

## 2016-07-20 NOTE — BHH Counselor (Signed)
Adult Comprehensive Assessment  Patient ID: Mary Andersen, female   DOB: 03/09/1986, 30 y.o.   MRN: [id number]  Information Source: Information source: Patient  Current Stressors:  Educational / Learning stressors: n/a Employment / Job issues: Pt is employed at a retirement community.  Family Relationships: Pt is currently in couples therapy with her husband.  Financial / Lack of resources (include bankruptcy): Pt has financial strain due impulse buying habits. 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  Housing / Lack of housing: Pt recently moved into a new house. Pt is consistently rearranging her house and buying things for her house.  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  Social relationships: n/a Substance abuse: Patient denies Bereavement / Loss: Pt lost her adoptive father about 3 years ago.   Living/Environment/Situation:  Living Arrangements: Spouse/significant other, Children Living conditions (as described by patient or guardian): Pt recently moved to a new house.  How long has patient lived in current situation?: Less than a year What is atmosphere in current home: Supportive, Comfortable  Family History:  Marital status: Married Number of Years Married: 4 What types of issues is patient dealing with in the relationship?: Patient cheated on her husband for 6 months.  Additional relationship information: Pt and her husband are currently in couples therapy. Are you sexually active?: Yes What is your sexual orientation?: heterosexual Has your sexual activity been affected by drugs, alcohol, medication, or emotional stress?: n/a Does patient have children?: Yes How many children?: 1 How is patient's relationship with their children?: Pt experiences Post-partum depression after her daughter was born. Pt states she has a good relationship with her daughter.   Childhood History:  By whom  was/is the patient raised?: Adoptive parents Additional childhood history information: Pt was adopted at the age of 13 months. Pt has no information about her biological family.  Description of patient's relationship with caregiver when they were a child: Pt states she has "great" relationship with her mother and she didn't have a close relationship with her father.  Patient's description of current relationship with people who raised him/her: Pt's father is deceased. Pt speaks to her mother daily. Her mother lives down the road.  How were you disciplined when you got in trouble as a child/adolescent?: n/a Does patient have siblings?: Yes Number of Siblings: 2 Description of patient's current relationship with siblings: Pt is not close to her older brother. Pt is close with her younger brother but she rarely see's him. He lives in [place].  Did patient suffer any verbal/emotional/physical/sexual abuse as a child?: No Did patient suffer from severe childhood neglect?: No Has patient ever been sexually abused/assaulted/raped as an adolescent or adult?: No Was the patient ever a victim of a crime or a disaster?: No Witnessed domestic violence?: No Has patient been effected by domestic violence as an adult?: No  Education:  Highest grade of school patient has completed: Pt has a Production designer, theatre/television/film.  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 How long has patient been employed?: 7 years Patient's job has been impacted by current illness: Yes Describe how patient's job has been impacted: Pt has little to no energy due to her depressive symptoms.  What is the longest time patient has a held a job?: 7 years. 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 Has patient  ever been in the [employer]?: No Has patient ever served in combat?: No Did You Receive  Any Psychiatric Treatment/Services While in the [employer]?: No Are There Guns or Other Weapons in [place]?: No Are These Weapons Safely Secured?:  (n/a)  Financial Resources:   Financial resources: Income from employment Does patient have a representative payee or guardian?: No  Alcohol/Substance Abuse:   What has been your use of drugs/alcohol within the last 12 months?: Patient denies If attempted suicide, did drugs/alcohol play a role in this?: No Alcohol/Substance Abuse Treatment Hx: Denies past history Has alcohol/substance abuse ever caused legal problems?: No  Social Support System:   Patient's Community Support System: Good Describe Community Support System: Pt has support from her husband and her mother.  Type of faith/religion: n/a How does patient's faith help to cope with current illness?: n/a  Leisure/Recreation:   Leisure and Hobbies: Pt enjoys taking walks  Strengths/Needs:   What things does the patient do well?: Pt states she is good at her job.  In what areas does patient struggle / problems for patient: Pt reports struggling with depression, finances, and suicidal thoughts.   Discharge Plan:   Does patient have access to transportation?: Yes Will patient be returning to same living situation after discharge?: Yes Currently receiving community mental health services: Yes (From Whom) (Dr. Nicolasa Ducking) Does patient have financial barriers related to discharge medications?: No  Summary/Recommendations:    Patient is a 30 year old female admitted with a diagnosis of Major Depressive disorder. Information was obtained from patient and chart review. Patient presented to the hospital voluntarily with suicidal ideations and depression.Patient reports primary triggers for admission were experiencing postpartum after the birth of her daughter over 2 years ago, not feeling safe to go home, and impulse shopping that has created financial stress. Patient sees Bettey Mare for  couples therapy but does not have a current individual therapist. Patient met with Dr. Nicolasa Ducking before she was admitted to hospital. Patient will benefit from crisis stabilization, medication evaluation, group therapy and psycho education in addition to case management for discharge. At discharge, it is recommended that patient remain compliant with established discharge plan and continued treatment.    Wakeelah Solan G. Cliffdell, [place]  07/20/2016 2:40 PM

## 2016-07-20 NOTE — [facility] Group Notes (Signed)
[facility] Group Notes:  (Nursing/MHT/Case Management/Adjunct)  Date:  07/20/2016  Time:  3:38 AM  Type of Therapy:  Group Therapy  Participation Level:  Active  Participation Quality:  Attentive  Affect:  Tearful  Cognitive:  Alert  Insight:  Appropriate  Engagement in Group:  Resistant  Modes of Intervention:  Discussion  Summary of Progress/Problems:Group was held outside. Pt attended group and sat with fellow pt's. But when staff attempted to engage pt in conversation she became tearful and asked if she could go inside. Pt left group early.   Fanny Skatesshley Imani Chistine Dematteo 07/20/2016, 3:38 AM

## 2016-07-21 LAB — PROLACTIN: Prolactin: 10.8 ng/mL (ref 4.8–23.3)

## 2016-07-21 MED ORDER — ZIPRASIDONE HCL 40 MG PO CAPS
40.0000 mg | ORAL_CAPSULE | Freq: Two times a day (BID) | ORAL | Status: DC
  Administered 2016-07-21 – 2016-07-24 (×6): 40 mg via ORAL
  Filled 2016-07-21 (×6): qty 1

## 2016-07-21 MED ORDER — OXCARBAZEPINE 150 MG PO TABS
150.0000 mg | ORAL_TABLET | Freq: Two times a day (BID) | ORAL | Status: DC
  Administered 2016-07-21 – 2016-07-24 (×6): 150 mg via ORAL
  Filled 2016-07-21 (×6): qty 1

## 2016-07-21 NOTE — Progress Notes (Signed)
D: Patient affect brighter today. Reports feeling better. Reports understanding more about her condition. Denies SI, HI, AVH. A: Encouragement and support offered.   R: Pt receptive and remains safe on unit with q 15 min checks.

## 2016-07-21 NOTE — [facility] Group Notes (Signed)
[facility] Group Notes:  (Nursing/MHT/Case Management/Adjunct)  Date:  07/21/2016  Time:  3:50 PM  Type of Therapy:  Psychoeducational Skills  Participation Level:  Did Not Attend   Darrold Bezek Travis Marny Smethers 07/21/2016, 3:50 PM 

## 2016-07-21 NOTE — Progress Notes (Signed)
D: Observed pt visiting with husband. Patient alert and oriented x4. Patient denies SI/HI/AVH. Pt affect is depressed. Pt stated her mood is "better...definitely better than yesterday." Pt stated "I realize I need to be here." Pt going to groups and endorsed benefiting from knowing "I'm not the only one going through this." Pt rated depression 4/10 and anxiety 3/10.  A: Offered active listening and support. Provided therapeutic communication. Administered scheduled medications.  R: Pt pleasant and cooperative. Pt medication compliant. Will continue Q15 min. checks. Safety maintained.

## 2016-07-21 NOTE — Plan of Care (Signed)
Problem: Medication: Goal: Compliance with prescribed medication regimen will improve Outcome: Progressing Pt compliant with medication regimen.   

## 2016-07-21 NOTE — Progress Notes (Signed)
Recreation Therapy Notes  Date: 08.11.17 Time: 1:00 pm Location: Craft Room  Group Topic: Coping Skills  Goal Area(s) Addresses:  Patient will participate in healthy coping skill. Patient will verbalize benefit of using art as a coping skill.  Behavioral Response: Attentive, Interactive  Intervention: Coloring  Activity: Patients were given coloring sheets to color and were instructed to think about the emotions they were feeling and what their minds were focused on.  Education: LRT educated patients on healthy coping skills.  Education Outcome: Acknowledges education/In group clarification offered  Clinical Observations/Feedback: Patient colored coloring sheet. Patient contributed to group discussion by stating what makes art a good coping skill.  [name],Allyson Tineo M, LRT/CTRS 07/21/2016 2:26 PM

## 2016-07-21 NOTE — [facility] Group Notes (Signed)
[facility] LCSW Group Therapy  07/21/2016 2:48 PM  Type of Therapy:  Group Therapy  Participation Level:  Pt did not attend group. CSW invited pt to group.   Summary of Progress/Problems: Feelings around Relapse. Group members discussed the meaning of relapse and shared personal stories of relapse, how it affected them and others, and how they perceived themselves during this time. Group members were encouraged to identify triggers, warning signs and coping skills used when facing the possibility of relapse. Social supports were discussed and explored in detail.   Gery Sabedra G. Garnette CzechSampson MSW, LCSWA 07/21/2016, 2:49 PM

## 2016-07-21 NOTE — Plan of Care (Signed)
Problem: [facility] Participation in Recreation Therapeutic Interventions Goal: STG-Patient will demonstrate improved self esteem by identif STG: Self-Esteem - Within 4 treatment sessions, patient will verbalize at least 5 positive affirmation statements in each of 2 treatment sessions to increase self-esteem post d/c.  Outcome: Progressing Treatment Session 1; Completed 1 out of 2: At approximately 11:50 am, LRT met with patient in patient room. Patient verbalized 5 positive affirmation statements. Patient reported it felt "good". LRT encouraged patient to continue saying positive affirmation statements.  Leonette Monarch, LRT/CTRS 08.11.17 12:44 pm Goal: STG-Other Recreation Therapy Goal (Specify) STG: Stress Management - Within 4 treatment sessions, patient will verbalize understanding of the stress management techniques in each of 2 treatment sessions to increase stress management skills post d/c.  Outcome: Progressing Treatment Session 1; Completed 1 out of 2: At approximately 11:50 am, LRT met with patient in patient room. LRT educated and provided patient with handouts on stress management techniques. Patient verbalized understanding. LRT encouraged patient to read over and practice the stress management techniques.  Leonette Monarch, LRT/CTRS 08.11.17 12:46 pm

## 2016-07-21 NOTE — Progress Notes (Addendum)
[facility] MD Progress Note  07/21/2016 6:14 PM Oluwadara Gorman  MRN:  [id number]  Subjective: [name] is an 30 year old female with a history of depression and anxiety admitted for suicidal ideation. Information was obtained from suggestive of bipolar disorder. The patient was spending money engaging in inappropriate behaviors. For the past 6 months she had intrusive suicidal thoughts to drive her car off the road. She was brought to the hospital from Dr. Shelda Altes office where she was seen for the first time. She has been maintained on a combination of Wellbutrin, Zoloft, and Abilify by her primary care provider. We will switch to bipolar medications and we'll offer Trileptal and Geodon for now.  Today the patient is much more forthcoming with information she admits thatv written by her mother as well as information from her therapist are are true and that she's been struggling with mood swings and poor decision making. She agrees to change her medications. There are no somatic complaints. Good group participation.  Principal Problem: Major depressive disorder, recurrent, with postpartum onset (HCC) Diagnosis:   Patient Active Problem List   Diagnosis Date Noted  . Major depressive disorder, recurrent, with postpartum onset (HCC) [W09.811, F33.9] 07/19/2016    Priority: High  . Suicidal ideation [R45.851] 07/19/2016  . Fatigue [R53.83] 02/10/2016  . Attention and concentration deficit [R41.840] 06/30/2015  . Depression [F32.9] 05/08/2014  . 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 Vertigo [R42] 04/29/2012   Total Time spent with patient: 20 minutes  Past Psychiatric History: Depression and anxiety.  Past Medical History:  Past Medical History:  Diagnosis Date  . 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 Depression   . Vertigo    History reviewed. No pertinent surgical history. Family History:  Family History  Problem Relation Age of Onset  .  Adopted: Yes   Family Psychiatric  History: unknown. Social History:  History  Alcohol Use No     History  Drug Use No    Social History   Social History  . Marital status: Married    Spouse name: N/A  . Number of children: N/A  . Years of education: N/A   Social History Main Topics  . Smoking status: Never Smoker  . Smokeless tobacco: Never Used  . Alcohol use No  . Drug use: No  . Sexual activity: Not Currently   Other Topics Concern  . None   Social History Narrative   The patient did not know her biological parents. She was was adopted and does not know her biological parents. Her adopted father died 3 years ago. She had a very close relationship with both parents and denies any history of any physical or sexual abuse. 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 She has been married for 4 years and has one 24-year-old daughter and then one stepson age 50. Her stepson spends half her time with her and her husband and half of the time with his biological mother. Her husband works as a Investment banker, operational. She denies any marital conflict.         Additional Social History:                         Sleep: Fair  Appetite:  Fair  Current Medications: Current Facility-Administered Medications  Medication Dose Route Frequency Provider Last Rate Last Dose  . acetaminophen (  TYLENOL) tablet 650 mg  650 mg Oral Q6H PRN Darliss RidgelAarti K Kapur, MD      . alum & mag hydroxide-simeth (MAALOX/MYLANTA) 200-200-20 MG/5ML suspension 30 mL  30 mL Oral Q4H PRN Darliss RidgelAarti K Kapur, MD      . magnesium hydroxide (MILK OF MAGNESIA) suspension 30 mL  30 mL Oral Daily PRN Darliss RidgelAarti K Kapur, MD      . OXcarbazepine (TRILEPTAL) tablet 150 mg  150 mg Oral BID Jolanta B Pucilowska, MD      . sertraline (ZOLOFT) tablet 200 mg  200 mg Oral Daily Jolanta B Pucilowska, MD   200 mg at 07/21/16 0935  . traZODone (DESYREL) tablet 100 mg  100 mg  Oral QHS Shari ProwsJolanta B Pucilowska, MD   100 mg at 07/20/16 2112  . ziprasidone (GEODON) capsule 40 mg  40 mg Oral BID WC Shari ProwsJolanta B Pucilowska, MD        Lab Results:  Results for orders placed or performed during the hospital encounter of 07/19/16 (from the past 48 hour(s))  Hemoglobin A1c     Status: None   Collection Time: 07/20/16  6:49 AM  Result Value Ref Range   Hgb A1c MFr Bld 5.0 4.0 - 6.0 %  Prolactin     Status: None   Collection Time: 07/20/16  6:49 AM  Result Value Ref Range   Prolactin 10.8 4.8 - 23.3 ng/mL    Comment: (NOTE) Performed At: [facility] [address] [place], [place] [id number] Mila HomerHancock William F MD WU:[phone]   TSH     Status: None   Collection Time: 07/20/16  6:49 AM  Result Value Ref Range   TSH 0.975 0.350 - 4.500 uIU/mL    Blood Alcohol level:  Lab Results  Component Value Date   ETH <5 07/19/2016    Metabolic Disorder Labs: Lab Results  Component Value Date   HGBA1C 5.0 07/20/2016   Lab Results  Component Value Date   PROLACTIN 10.8 07/20/2016   Lab Results  Component Value Date   CHOL 203 (H) 07/19/2016   TRIG 74 07/19/2016   HDL 52 07/19/2016   CHOLHDL 3.9 07/19/2016   VLDL 15 07/19/2016   LDLCALC 136 (H) 07/19/2016   LDLCALC 112 (H) 05/31/2015    Physical Findings: AIMS: Facial and Oral Movements Muscles of Facial Expression: None, normal Lips and Perioral Area: None, normal Jaw: None, normal Tongue: None, normal,Extremity Movements Upper (arms, wrists, hands, fingers): None, normal Lower (legs, knees, ankles, toes): None, normal, Trunk Movements Neck, shoulders, hips: None, normal, Overall Severity Severity of abnormal movements (highest score from questions above): None, normal Incapacitation due to abnormal movements: None, normal Patient's awareness of abnormal movements (rate only patient's report): No Awareness, Dental Status Current problems with teeth and/or dentures?: Yes Does patient usually wear  dentures?: Yes  CIWA:    COWS:     Musculoskeletal: Strength & Muscle Tone: within normal limits Gait & Station: normal Patient leans: N/A  Psychiatric Specialty Exam: Physical Exam  Nursing note and vitals reviewed.   Review of Systems  Psychiatric/Behavioral: Positive for depression and suicidal ideas. The patient is nervous/anxious.   All other systems reviewed and are negative.   Blood pressure 112/68, pulse 75, temperature 98.1 F (36.7 C), temperature source Oral, resp. rate 18, height 5\' 1"  (1.549 m), weight 67.6 kg (149 lb), last menstrual period 07/07/2016, SpO2 98 %.Body mass index is 28.15 kg/m.  General Appearance: Casual  Eye Contact:  Good  Speech:  Clear and  Coherent  Volume:  Normal  Mood:  Depressed and Irritable  Affect:  Congruent  Thought Process:  Goal Directed  Orientation:  Full (Time, Place, and Person)  Thought Content:  WDL  Suicidal Thoughts:  Yes.  with intent/plan  Homicidal Thoughts:  No  Memory:  Immediate;   Fair Recent;   Fair Remote;   Fair  Judgement:  Impaired  Insight:  Shallow  Psychomotor Activity:  Normal  Concentration:  Concentration: Fair and Attention Span: Fair  Recall:  [employer] of Knowledge:  Fair  Language:  Fair  Akathisia:  No  Handed:  Right  AIMS (if indicated):     Assets:  Communication Skills Desire for Improvement Financial Resources/Insurance Housing Intimacy Physical Health Resilience Social Support Transportation Vocational/Educational  ADL's:  Intact  Cognition:  WNL  Sleep:  Number of Hours: 8     Treatment Plan Summary: Daily contact with patient to assess and evaluate symptoms and progress in treatment and Medication management   [name] is a 30 year old female with a history of depression admitted for worsening of her symptoms and aborted suicide attempt.  1. Suicidal ideation. The patient is able to contract for safety in the hospital.  2. Mood. The patient was maintained on a  combination of Abilify, Zoloft, and Wellbutrin. We discontinued Wellbutrin and Abilify, keep Zoloft, and start Trileptal and Geodon for mood stabilization.  3. Metabolic syndrome monitoring. Lipid profile, TSH, hemoglobin A1c, are normal, prolactin 10.5.   4. Insomnia. Trazodone is available.  5. Disposition. She will return to home with her husband. She will follow up with Dr. Maryruth Bun for medication management and her therapist.  Kristine Linea, MD 07/21/2016, 6:14 PM

## 2016-07-21 NOTE — [facility] Group Notes (Signed)
[facility] Group Notes:  (Nursing/MHT/Case Management/Adjunct)  Date:  07/21/2016  Time:  3:49 AM  Type of Therapy:  Psychoeducational Skills  Participation Level:  Active  Participation Quality:  Appropriate  Affect:  Appropriate  Cognitive:  Appropriate  Insight:  Appropriate  Engagement in Group:  Engaged  Modes of Intervention:  Discussion, Socialization and Support  Summary of Progress/Problems:  Mary Andersen 07/21/2016, 3:49 AM

## 2016-07-22 NOTE — Plan of Care (Signed)
Problem: Self-Concept: Goal: Level of anxiety will decrease Outcome: Progressing Pt rated anxiety 3/10 and appeared less anxious than previous night

## 2016-07-22 NOTE — [facility] Group Notes (Signed)
[facility] Group Notes:  (Nursing/MHT/Case Management/Adjunct)  Date:  07/22/2016  Time:  11:07 AM  Type of Therapy:  goal setting   Participation Level:  Did Not Attend  [name] C [name] 07/22/2016, 11:07 AM 

## 2016-07-22 NOTE — Progress Notes (Signed)
Pt has been pleasant and cooperative. Pt has been seclusive to her room. Pt denies SI and A/V hallucinations. Pt not attending unit activities.

## 2016-07-22 NOTE — Progress Notes (Signed)
[facility]  07/22/2016 1:59 PM Mary DingwallJessica Andersen  MRN:  [id number]  Subjective: Ms. Mary MossSteward is an 30 year old female with a history of depression and anxiety admitted for suicidal ideation. Information was obtained from suggestive of bipolar disorder. The patient was spending money engaging in inappropriate behaviors. For the past 6 months she had intrusive suicidal thoughts to drive her car off the road. She was brought to the hospital from Dr. Shelda Andersen's office where she was seen for the first time. She has been maintained on a combination of Wellbutrin, Zoloft, and Abilify by her primary care provider. We will switch to bipolar medications and we'll offer Trileptal and Geodon for now.  Patient interviewed. Chart reviewed. Patient reports that she is still feeling somewhat depressed but perhaps a little better. She denies any active suicidal thoughts. She is staying in bed much of the time however and says she is feeling tired. Denies any hallucinations or delusions today. Denies any side effects of medicine. Vital signs stable.  Principal Problem: Major depressive disorder, recurrent, with postpartum onset (HCC) Diagnosis:   Patient Active Problem List   Diagnosis Date Noted  . Major depressive disorder, recurrent, with postpartum onset (HCC) [Q65.784[O99.345, F33.9] 07/19/2016  . Suicidal ideation [R45.851] 07/19/2016  . Fatigue [R53.83] 02/10/2016  . Attention and concentration deficit [R41.840] 06/30/2015  . Depression [F32.9] 05/08/2014  . 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 Vertigo [R42] 04/29/2012   Total Time spent with patient: 20 minutes  Past Psychiatric History: Depression and anxiety.  Past Medical History:  Past Medical History:  Diagnosis Date  . 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 Depression   . Vertigo    History reviewed. No pertinent surgical history. Family History:  Family History  Problem Relation Age of Onset  .  Adopted: Yes   Family Psychiatric  History: unknown. Social History:  History  Alcohol Use No     History  Drug Use No    Social History   Social History  . Marital status: Married    Spouse name: N/A  . Number of children: N/A  . Years of education: N/A   Social History Main Topics  . Smoking status: Never Smoker  . Smokeless tobacco: Never Used  . Alcohol use No  . Drug use: No  . Sexual activity: Not Currently   Other Topics Concern  . None   Social History Narrative   The patient did not know her biological parents. She was was adopted and does not know her biological parents. Her adopted father died 3 years ago. She had a very close relationship with both parents and denies any history of any physical or sexual abuse. 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 She has been married for 4 years and has one 637-year-old daughter and then one stepson age 31. Her stepson spends half her time with her and her husband and half of the time with his biological mother. Her husband works as a Investment banker, operationalchef. She denies any marital conflict.         Additional Social History:                         Sleep: Fair  Appetite:  Fair  Current Medications: Current Facility-Administered Medications  Medication Dose Route Frequency Provider Last Rate Last Dose  . acetaminophen (TYLENOL) tablet  650 mg  650 mg Oral Q6H PRN Darliss Ridgel, MD      . alum & mag hydroxide-simeth (MAALOX/MYLANTA) 200-200-20 MG/5ML suspension 30 mL  30 mL Oral Q4H PRN Darliss Ridgel, MD      . magnesium hydroxide (MILK OF MAGNESIA) suspension 30 mL  30 mL Oral Daily PRN Darliss Ridgel, MD      . OXcarbazepine (TRILEPTAL) tablet 150 mg  150 mg Oral BID Shari Prows, MD   150 mg at 07/22/16 0837  . sertraline (ZOLOFT) tablet 200 mg  200 mg Oral Daily Shari Prows, MD   200 mg at 07/22/16 0837  . traZODone (DESYREL)  tablet 100 mg  100 mg Oral QHS Jolanta B Pucilowska, MD   100 mg at 07/21/16 2117  . ziprasidone (GEODON) capsule 40 mg  40 mg Oral BID WC Jolanta B Pucilowska, MD   40 mg at 07/22/16 1610    Lab Results:  No results found for this or any previous visit (from the past 48 hour(s)).  Blood Alcohol level:  Lab Results  Component Value Date   ETH <5 07/19/2016    Metabolic Disorder Labs: Lab Results  Component Value Date   HGBA1C 5.0 07/20/2016   Lab Results  Component Value Date   PROLACTIN 10.8 07/20/2016   Lab Results  Component Value Date   CHOL 203 (H) 07/19/2016   TRIG 74 07/19/2016   HDL 52 07/19/2016   CHOLHDL 3.9 07/19/2016   VLDL 15 07/19/2016   LDLCALC 136 (H) 07/19/2016   LDLCALC 112 (H) 05/31/2015    Physical Findings: AIMS: Facial and Oral Movements Muscles of Facial Expression: None, normal Lips and Perioral Area: None, normal Jaw: None, normal Tongue: None, normal,Extremity Movements Upper (arms, wrists, hands, fingers): None, normal Lower (legs, knees, ankles, toes): None, normal, Trunk Movements Neck, shoulders, hips: None, normal, Overall Severity Severity of abnormal movements (highest score from questions above): None, normal Incapacitation due to abnormal movements: None, normal Patient's awareness of abnormal movements (rate only patient's report): No Awareness, Dental Status Current problems with teeth and/or dentures?: Yes Does patient usually wear dentures?: Yes  CIWA:    COWS:     Musculoskeletal: Strength & Muscle Tone: within normal limits Gait & Station: normal Patient leans: N/A  Psychiatric Specialty Exam: Physical Exam  Nursing note and vitals reviewed. Constitutional: She appears well-developed and well-nourished.  HENT:  Head: Normocephalic and atraumatic.  Eyes: Conjunctivae are normal. Pupils are equal, round, and reactive to light.  Neck: Normal range of motion.  Cardiovascular: Normal heart sounds.   Respiratory:  Effort normal.  GI: Soft.  Musculoskeletal: Normal range of motion.       Arms: Neurological: She is alert.  Skin: Skin is warm and dry.  Psychiatric: Judgment normal. Her affect is blunt. Her speech is delayed. She is slowed. Cognition and memory are normal. She expresses no suicidal ideation.    Review of Systems  Constitutional: Negative.   HENT: Negative.   Eyes: Negative.   Respiratory: Negative.   Cardiovascular: Negative.   Gastrointestinal: Negative.   Musculoskeletal: Negative.   Skin: Negative.   Neurological: Negative.   Psychiatric/Behavioral: Positive for depression and suicidal ideas. The patient is nervous/anxious.   All other systems reviewed and are negative.   Blood pressure 102/64, pulse (!) 56, temperature 98.6 F (37 C), temperature source Oral, resp. rate 18, height  (1.549 m), weight 67.6 kg (149 lb), last menstrual period 07/07/2016, SpO2 98 %.Body mass  index is 28.15 kg/m.  General Appearance: Casual  Eye Contact:  Good  Speech:  Clear and Coherent  Volume:  Normal  Mood:  Depressed and Irritable  Affect:  Congruent  Thought Process:  Goal Directed  Orientation:  Full (Time, Place, and Person)  Thought Content:  WDL  Suicidal Thoughts:  Yes.  with intent/plan  Homicidal Thoughts:  No  Memory:  Immediate;   Fair Recent;   Fair Remote;   Fair  Judgement:  Impaired  Insight:  Shallow  Psychomotor Activity:  Normal  Concentration:  Concentration: Fair and Attention Span: Fair  Recall:  [employer] of Knowledge:  Fair  Language:  Fair  Akathisia:  No  Handed:  Right  AIMS (if indicated):     Assets:  Communication Skills Desire for Improvement Financial Resources/Insurance Housing Intimacy Physical Health Resilience Social Support Transportation Vocational/Educational  ADL's:  Intact  Cognition:  WNL  Sleep:  Number of Hours: 7     Treatment Plan Summary: Daily contact with patient to assess and evaluate symptoms and progress in  treatment and Medication management   [name] is a 30 year old female with a history of depression admitted for worsening of her symptoms and aborted suicide attempt.  1. Suicidal ideation. The patient is able to contract for safety in the hospital.  2. Mood. The patient was maintained on a combination of Abilify, Zoloft, and Wellbutrin. We discontinued Wellbutrin and Abilify, keep Zoloft, and start Trileptal and Geodon for mood stabilization.  3. Metabolic syndrome monitoring. Lipid profile, TSH, hemoglobin A1c, are normal, prolactin 10.5.   4. Insomnia. Trazodone is available.  5. Disposition. She will return to home with her husband. She will follow up with Dr. Maryruth Bun for medication management and her therapist.  As of Saturday the 12th patient continues to have some depression but her mood is generally feeling better. No signs of acute mania. Thinking clearly but sedated. Denies acute suicidal ideation but still does not feel baseline. No new labs today. Vital signs stable no change to medicine.  Mordecai Rasmussen, MD 07/22/2016, 1:59 PM

## 2016-07-22 NOTE — [facility] Group Notes (Signed)
[facility] Group Notes:  (Nursing/MHT/Case Management/Adjunct)  Date:  07/22/2016  Time:  2:38 AM  Type of Therapy:  Psychoeducational Skills  Participation Level:  Active  Participation Quality:  Appropriate  Affect:  Appropriate  Cognitive:  Appropriate  Insight:  Appropriate  Engagement in Group:  Engaged  Modes of Intervention:  Discussion, Socialization and Support  Summary of Progress/Problems:  Mary MilroyLaquanda Y Tianna Andersen 07/22/2016, 2:38 AM

## 2016-07-22 NOTE — Tx Team (Signed)
Interdisciplinary Treatment Plan Update (Adult)  Date:  07/22/2016 Time Reviewed:  11:05 AM  Progress in Treatment: Attending groups: Yes. Participating in groups:  Yes. Taking medication as prescribed:  Yes. Tolerating medication:  Yes. Family/Significant othe contact made:  Yes, individual(s) contacted:  Mary Andersen (Husband) [phone] Patient understands diagnosis:  Yes. Discussing patient identified problems/goals with staff:  Yes. Medical problems stabilized or resolved:  Yes. Denies suicidal/homicidal ideation: Yes. Issues/concerns per patient self-inventory:  No. Other:  New problem(s) identified: n/a  Discharge Plan or Barriers: Patient will return home with her husband and have follow-up with Dr. Nicolasa Ducking for medication management and Nancy Marus (outpatient therapist). Husband will be patients ride at discharge.   Reason for Continuation of Hospitalization: Depression Medication stabilization Suicidal ideation  Comments: n/a  Estimated length of stay: 3 to 5 days  New goal(s): n/a  Review of initial/current patient goals per problem list:   1.? Goal(s): Patient will participate in aftercare plan o Met:? No o Target date: at discharge  o As evidenced by: Patient will participate within aftercare plan AEB aftercare provider and housing plan at discharge being identified.  ? 2.? Goal (s): Patient will exhibit decreased depressive symptoms and suicidal ideations. o Met:?  No o ?Target date: at discharge  o As evidenced by: Patient will utilize self rating of depression at 3 or below and demonstrate decreased signs of depression or be deemed stable for discharge by MD.    Attendees: Patient:  Mary Andersen 8/12/201711:05 AM  Family:   8/12/201711:05 AM  Physician:  Dr. Bary Leriche, MD 8/12/201711:05 AM  Nursing:   Floyde Parkins, RN 8/12/201711:05 AM  Case Manager:   8/12/201711:05 AM  Counselor:   8/12/201711:05 AM  Other:  Everitt Amber, LRT 8/12/201711:05  AM  Other:  Lear Ng. Claybon Jabs MSW, [place] 8/12/201711:05 AM  Other:   8/12/201711:05 AM  Other:  8/12/201711:05 AM  Other:  8/12/201711:05 AM  Other:  8/12/201711:05 AM  Other:  8/12/201711:05 AM  Other:  8/12/201711:05 AM  Other:  8/12/201711:05 AM  Other:   8/12/201711:05 AM   Scribe for Treatment Team:   Lear Ng. [place], [place]  07/22/2016 11:08 AM

## 2016-07-22 NOTE — [facility] Group Notes (Signed)
[facility] LCSW Group Therapy  07/22/2016 2:24 PM  Type of Therapy:  Group Therapy  Participation Level:  Pt did not attend group. CSW invited pt to group.   Summary of Progress/Problems: Self esteem: Patients discussed self esteem and how it impacts them. They discussed what aspects in their lives has influenced their self esteem. They were challenged to identify changes that are needed in order to improve self esteem. Patients participated in activity where they had to identify positive adjectives they felt described their personality. Patients shared with the group on the following areas: Things I am good at, What I like about my appearance, I've helped others by, What I value the most, compliments I have received, challenges I have overcome, thing that make me unique, and Times I've made others happy.   Mary Andersen MSW, LCSWA 07/22/2016, 2:24 PM

## 2016-07-22 NOTE — Progress Notes (Signed)
D: Observed pt in dayroom visiting with family. Patient alert and oriented x4. Patient denies SI/HI/AVH. Pt affect is depressed. Pt stated "I've learned to accept being here." Pt stated her mood is "calm." Pt rated depression 3/10 and anxiety 3/10.  A: Offered active listening and support. Provided therapeutic communication. Administered scheduled medications. Encouraged pt to continue attending group and actively participating in care.  R: Pt pleasant and cooperative. Pt medication compliant. Will continue Q15 min. checks. Safety maintained.

## 2016-07-23 NOTE — Plan of Care (Signed)
Problem: Coping: Goal: Ability to cope will improve Outcome: Progressing Pt verbalized one coping skill tonight [place] RN

## 2016-07-23 NOTE — Progress Notes (Signed)
[facility]  07/23/2016 4:27 PM Mary DingwallJessica Andersen  MRN:  [id number]  Subjective: [name] is an 30 year old female with a history of depression and anxiety admitted for suicidal ideation. Information was obtained from suggestive of bipolar disorder. The patient was spending money engaging in inappropriate behaviors. For the past 6 months she had intrusive suicidal thoughts to drive her car off the road. She was brought to the hospital from Dr. Shelda AltesKapur's office where she was seen for the first time. She has been maintained on a combination of Wellbutrin, Zoloft, and Abilify by her primary care provider. We will switch to bipolar medications and we'll offer Trileptal and Geodon for now.  Follow-up Sunday the 13th. Patient once again is laying in bed but she tells me that this morning she was able to attend some groups. She says she feels like her mood is a little better. Denies any suicidal ideation. Denies feeling overwhelmed. Affect still blunted. Tolerating medicine. No new physical complaints. Vital stable. Encourage patient to continue working on getting up and being active. No other change to medicine.  Principal Problem: Major depressive disorder, recurrent, with postpartum onset (HCC) Diagnosis:   Patient Active Problem List   Diagnosis Date Noted  . Major depressive disorder, recurrent, with postpartum onset (HCC) [L38.101[O99.345, F33.9] 07/19/2016  . Suicidal ideation [R45.851] 07/19/2016  . Fatigue [R53.83] 02/10/2016  . Attention and concentration deficit [R41.840] 06/30/2015  . Depression [F32.9] 05/08/2014  . 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 Vertigo [R42] 04/29/2012   Total Time spent with patient: 20 minutes  Past Psychiatric History: Depression and anxiety.  Past Medical History:  Past Medical History:  Diagnosis Date  . 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 Depression   . Vertigo    History reviewed. No pertinent surgical  history. Family History:  Family History  Problem Relation Age of Onset  . Adopted: Yes   Family Psychiatric  History: unknown. Social History:  History  Alcohol Use No     History  Drug Use No    Social History   Social History  . Marital status: Married    Spouse name: N/A  . Number of children: N/A  . Years of education: N/A   Social History Main Topics  . Smoking status: Never Smoker  . Smokeless tobacco: Never Used  . Alcohol use No  . Drug use: No  . Sexual activity: Not Currently   Other Topics Concern  . None   Social History Narrative   The patient did not know her biological parents. She was was adopted and does not know her biological parents. Her adopted father died 3 years ago. She had a very close relationship with both parents and denies any history of any physical or sexual abuse. 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 She has been married for 4 years and has one 31-year-old daughter and then one stepson age 210. Her stepson spends half her time with her and her husband and half of the time with his biological mother. Her husband works as a Investment banker, operationalchef. She denies any marital conflict.         Additional Social History:                         Sleep: Fair  Appetite:  Fair  Current Medications: Current Facility-Administered Medications  Medication Dose Route Frequency Provider Last Rate Last Dose  . acetaminophen (TYLENOL) tablet 650 mg  650 mg Oral Q6H PRN Darliss Ridgel, MD      . alum & mag hydroxide-simeth (MAALOX/MYLANTA) 200-200-20 MG/5ML suspension 30 mL  30 mL Oral Q4H PRN Darliss Ridgel, MD      . magnesium hydroxide (MILK OF MAGNESIA) suspension 30 mL  30 mL Oral Daily PRN Darliss Ridgel, MD      . OXcarbazepine (TRILEPTAL) tablet 150 mg  150 mg Oral BID Shari Prows, MD   150 mg at 07/23/16 0839  . sertraline (ZOLOFT) tablet 200 mg  200 mg Oral  Daily Jolanta B Pucilowska, MD   200 mg at 07/23/16 0839  . traZODone (DESYREL) tablet 100 mg  100 mg Oral QHS Shari Prows, MD   100 mg at 07/22/16 2142  . ziprasidone (GEODON) capsule 40 mg  40 mg Oral BID WC Jolanta B Pucilowska, MD   40 mg at 07/23/16 9604    Lab Results:  No results found for this or any previous visit (from the past 48 hour(s)).  Blood Alcohol level:  Lab Results  Component Value Date   ETH <5 07/19/2016    Metabolic Disorder Labs: Lab Results  Component Value Date   HGBA1C 5.0 07/20/2016   Lab Results  Component Value Date   PROLACTIN 10.8 07/20/2016   Lab Results  Component Value Date   CHOL 203 (H) 07/19/2016   TRIG 74 07/19/2016   HDL 52 07/19/2016   CHOLHDL 3.9 07/19/2016   VLDL 15 07/19/2016   LDLCALC 136 (H) 07/19/2016   LDLCALC 112 (H) 05/31/2015    Physical Findings: AIMS: Facial and Oral Movements Muscles of Facial Expression: None, normal Lips and Perioral Area: None, normal Jaw: None, normal Tongue: None, normal,Extremity Movements Upper (arms, wrists, hands, fingers): None, normal Lower (legs, knees, ankles, toes): None, normal, Trunk Movements Neck, shoulders, hips: None, normal, Overall Severity Severity of abnormal movements (highest score from questions above): None, normal Incapacitation due to abnormal movements: None, normal Patient's awareness of abnormal movements (rate only patient's report): No Awareness, Dental Status Current problems with teeth and/or dentures?: Yes Does patient usually wear dentures?: Yes  CIWA:    COWS:     Musculoskeletal: Strength & Muscle Tone: within normal limits Gait & Station: normal Patient leans: N/A  Psychiatric Specialty Exam: Physical Exam  Nursing note and vitals reviewed. Constitutional: She appears well-developed and well-nourished.  HENT:  Head: Normocephalic and atraumatic.  Eyes: Conjunctivae are normal. Pupils are equal, round, and reactive to light.  Neck:  Normal range of motion.  Cardiovascular: Normal heart sounds.   Respiratory: Effort normal.  GI: Soft.  Musculoskeletal: Normal range of motion.       Arms: Neurological: She is alert.  Skin: Skin is warm and dry.  Psychiatric: Judgment normal. Her affect is blunt. Her speech is delayed. She is slowed. Cognition and memory are normal. She expresses no suicidal ideation.    Review of Systems  Constitutional: Negative.   HENT: Negative.   Eyes: Negative.   Respiratory: Negative.   Cardiovascular: Negative.   Gastrointestinal: Negative.   Musculoskeletal: Negative.   Skin: Negative.   Neurological: Negative.   Psychiatric/Behavioral: Positive for depression and suicidal ideas. The patient is nervous/anxious.   All other systems reviewed and are negative.   Blood pressure 113/75, pulse 90, temperature 98 F (36.7 C), temperature source Oral, resp. rate 18, height 5\' 1"  (1.549 m),  weight 67.6 kg (149 lb), last menstrual period 07/07/2016, SpO2 98 %.Body mass index is 28.15 kg/m.  General Appearance: Casual  Eye Contact:  Good  Speech:  Clear and Coherent  Volume:  Normal  Mood:  Depressed and Irritable  Affect:  Congruent  Thought Process:  Goal Directed  Orientation:  Full (Time, Place, and Person)  Thought Content:  WDL  Suicidal Thoughts:  Yes.  with intent/plan  Homicidal Thoughts:  No  Memory:  Immediate;   Fair Recent;   Fair Remote;   Fair  Judgement:  Impaired  Insight:  Shallow  Psychomotor Activity:  Normal  Concentration:  Concentration: Fair and Attention Span: Fair  Recall:  [employer] of Knowledge:  Fair  Language:  Fair  Akathisia:  No  Handed:  Right  AIMS (if indicated):     Assets:  Communication Skills Desire for Improvement Financial Resources/Insurance Housing Intimacy Physical Health Resilience Social Support Transportation Vocational/Educational  ADL's:  Intact  Cognition:  WNL  Sleep:  Number of Hours: 7     Treatment Plan  Summary: Daily contact with patient to assess and evaluate symptoms and progress in treatment and Medication management   [name] is a 30 year old female with a history of depression admitted for worsening of her symptoms and aborted suicide attempt.  1. Suicidal ideation. The patient is able to contract for safety in the hospital.  2. Mood. The patient was maintained on a combination of Abilify, Zoloft, and Wellbutrin. We discontinued Wellbutrin and Abilify, keep Zoloft, and start Trileptal and Geodon for mood stabilization.  3. Metabolic syndrome monitoring. Lipid profile, TSH, hemoglobin A1c, are normal, prolactin 10.5.   4. Insomnia. Trazodone is available.  5. Disposition. She will return to home with her husband. She will follow up with Dr. Maryruth Bun for medication management and her therapist.  As of Sunday the 13th patient says she is feeling better. Affect still blunted but she says she is feeling less stressed and denies suicidal ideation. No change to treatment plan today. Mordecai Rasmussen, MD 07/23/2016, 4:27 PM

## 2016-07-23 NOTE — Plan of Care (Signed)
Problem: Coping: Goal: Ability to verbalize frustrations and anger appropriately will improve Outcome: Progressing Pt talked about events leading to hospitalization . Pt noted to be very tearful stating " I miss my baby"

## 2016-07-23 NOTE — [facility] Group Notes (Signed)
[facility] Group Notes:  (Nursing/MHT/Case Management/Adjunct)  Date:  07/23/2016  Time:  12:20 PM  Type of Therapy:  Psychoeducational Skills  Participation Level:  Active  Participation Quality:  Appropriate, Attentive and Sharing  Affect:  Appropriate  Cognitive:  Alert and Appropriate  Insight:  Appropriate  Engagement in Group:  Engaged  Modes of Intervention:  Discussion, Education and Support  Summary of Progress/Problems:  Mary Andersen 07/23/2016, 12:20 PM

## 2016-07-23 NOTE — Progress Notes (Signed)
D: Patient is alert and oriented on the unit this shift. Patient attended and actively participated in groups today. Patient denies suicidal ideation, homicidal ideation, auditory or visual hallucinations at the present time.  A: Scheduled medications are administered to patient as per MD orders. Emotional support and encouragement are provided. Patient is maintained on q.15 minute safety checks. Patient is informed to notify staff with questions or concerns. R: No adverse medication reactions are noted. Patient is cooperative with medication administration and treatment plan today. Patient is receptive, depressed but anxious and cooperative on the unit at this time. Patient isolative on the unit this shift. Patient contracts for safety at this time. Patient remains safe at this time.

## 2016-07-23 NOTE — [facility] Group Notes (Signed)
[facility] LCSW Group Therapy  07/23/2016 3:12 PM  Type of Therapy:  Group Therapy  Participation Level:  Pt did not attend group. CSW invited pt to group.   Summary of Progress/Problems:Safety Planning: Patients identified fears or worries surrounding discharge. Patients offered support to their peers and openly developed safety plans for their individual needs. Patients developed their own safety plan. Patients discussed their warning signs, coping strategies, support system with family and friends, identified mental health professionals, and how to keep their environments safe (ex. Removing unnecessary medications or removing weapons/guns). Patients then discussed their personalized safety plan with the group.    Jalie Eiland G. Garnette CzechSampson MSW, LCSWA 07/23/2016, 3:15 PM

## 2016-07-24 DIAGNOSIS — F3160 Bipolar disorder, current episode mixed, unspecified: Secondary | ICD-10-CM | POA: Diagnosis present

## 2016-07-24 MED ORDER — SERTRALINE HCL 100 MG PO TABS: 200.0000 mg | ORAL_TABLET | Freq: Every day | ORAL | 0 refills | Status: DC

## 2016-07-24 MED ORDER — OXCARBAZEPINE 150 MG PO TABS: 150.0000 mg | ORAL_TABLET | Freq: Two times a day (BID) | ORAL | 0 refills | Status: DC

## 2016-07-24 MED ORDER — ZIPRASIDONE HCL 40 MG PO CAPS: 40.0000 mg | ORAL_CAPSULE | Freq: Two times a day (BID) | ORAL | 0 refills | Status: DC

## 2016-07-24 MED ORDER — TRAZODONE HCL 100 MG PO TABS: 100.0000 mg | ORAL_TABLET | Freq: Every day | ORAL | 0 refills | Status: DC

## 2016-07-24 NOTE — Plan of Care (Signed)
Problem: [facility] Participation in Recreation Therapeutic Interventions Goal: STG-Patient will demonstrate improved self esteem by identif STG: Self-Esteem - Within 4 treatment sessions, patient will verbalize at least 5 positive affirmation statements in each of 2 treatment sessions to increase self-esteem post d/c.  Outcome: Completed/Met Date Met: 07/24/16 Treatment Session 2; Completed 2 out of 2: At approximately 11:05 am, LRT met with patient in craft room. Patient verbalized 5 positive affirmation statements. Patient reported it felt "good". LRT encouraged patient to continue saying positive affirmation statements.  Leonette Monarch, LRT/CTRS 08.14.17 12:11 pm Goal: STG-Other Recreation Therapy Goal (Specify) STG: Stress Management - Within 4 treatment sessions, patient will verbalize understanding of the stress management techniques in each of 2 treatment sessions to increase stress management skills post d/c.  Outcome: Not Progressing Treatment Session 2; Completed 1 out of 2: At approximately 11:05 am, LRT met with patient in craft room. Patient reported she had not read over the stress management techniques. LRT encouraged patient to read over and practice the stress management techniques.  Leonette Monarch, LRT/CTRS 08.14.17 12:12 pm

## 2016-07-24 NOTE — Progress Notes (Signed)
Patient denies SI/HI, denies A/V hallucinations. Patient verbalizes understanding of discharge instructions, follow up care and prescriptions. Patient given all belongings from  locker. Patient escorted out by staff, transported by family. 

## 2016-07-24 NOTE — Progress Notes (Signed)
Recreation Therapy Notes  Date: 08.14.17 Time: 1:00 pm Location: Craft Room  Group Topic: Self-expression  Goal Area(s) Addresses:  Patient will identify at least one item per dimension of health. Patient will examine areas they are deficient in.  Behavioral Response: Attentive, Interactive  Intervention: 6 Dimensions of Health  Activity: Patients were given a definition sheet of the 6 dimensions of health and a worksheet with each dimension listed. Patients were encouraged to write 2-3 things they were doing in each dimension.  Education: LRT educated patients on how they can improve each dimension.  Education Outcome: Acknowledges education/In group clarification offered  Clinical Observations/Feedback: Patient completed activity by writing at least one item in all but one dimension. Patient contributed to group discussion by stating what areas she was giving enough attention to, what areas she was not giving enough attention to, how she can improve certain dimensions, and how this activity relates to her admission.  [name],Iolani Twilley M, LRT/CTRS 07/24/2016 2:49 PM

## 2016-07-24 NOTE — Discharge Summary (Signed)
Physician Discharge Summary Note  Patient:  Mary DingwallJessica Andersen is an 30 y.o., female MRN:  [id number] DOB:  [birth year] Patient phone:  [phone] (home)  Patient address:   [address] [place] [id number],  Total Time spent with patient: 30 minutes  Date of Admission:  07/19/2016 Date of Discharge: 07/24/2016  Reason for Admission:  Suicidal ideation.   Identifying data. [name] is a 30 year old married Caucasian female history recurrent depression.  Chief complaint. "I have suicidal thoughts."  History of present illness. Information was obtained from the patient and the chart. The patient has a history of depression that worsened postpartum 2-1/2 years ago. The patient presented at Dr. Shelda AltesKapur's office for the first time for psychotropic medication management for depression. She endorsed suicidal thoughts with inability to contract for safety. She reported intense thoughts of wanting to crash her car. She actually acted on these thoughts one week prior. The patient reports problems with depression that has been progressively worse since the birth of her daughter. She does reports excessive sleep and appetite, poor energy and concentration, feeling of guilt and hopelessness and worthlessness, social isolation crying spells that culminated in suicidal thinking. She also reports irritability, hyperactivity, and poor impulse control at times. Her mother reported that the patient confirmed that she has been spending money excessively. She also had an affair recently. Following the affair, the patient and her husband started couples therapy. She denies psychotic symptoms. She denies symptoms suggestive of bipolar mania but does report mood instability and has been "all over the place recently" sometimes feeling suicidal sometimes hyper. She reports heightened anxiety but no symptoms of PTSD or OCD. She denies alcohol or illicit substance use.  Past Psychiatric History. She denies any history  of any prior inpatient psychiatric hospitalizations or suicide attempts. She has been on Zoloft for about 5-6 years prescribed by her PCP. Recently Wellbutrin and Abilify were to her regimen with minimal improvement. She has been seeing a therapist, [name] for counseling over the past 2-3 months.   PMH Congenital Absence of of Left forearm She denies any history of any prior TBI or seizures  Social history The patient was adopted and does not know her biological parents. Her adopted father died 3 years ago. She had a very close relationship with both parents and denies any history of any physical or sexual abuse. 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 She has been married for 4 years and has one 31-year-old daughter and then one stepson age 31. Her stepson spends half her time with her and her husband and half of the time with his biological mother. Her husband works as a Investment banker, operationalchef. She denies any marital conflict.  Family Psychiatric History The patient was adopted from [place] at the age of 31 months and does not know her family history.   Substance Abuse History She denies any heavy alcohol use or illicit drug use.  Legal History She denies any prior arrests or incarceration  Principal Problem: Bipolar I disorder, most recent episode mixed [facility](HCC) Discharge Diagnoses: Patient Active Problem List   Diagnosis Date Noted  . Bipolar I disorder, most recent episode mixed (HCC) [F31.60] 07/24/2016    Priority: High  . Major depressive disorder, recurrent, with postpartum onset (HCC) [O13.086[O99.345, F33.9] 07/19/2016    Priority: High  . Suicidal ideation [R45.851] 07/19/2016  . Fatigue [R53.83] 02/10/2016  . Attention and concentration deficit [R41.840] 06/30/2015  .  Depression [F32.9] 05/08/2014  . 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 Vertigo [R42] 04/29/2012   Past  Medical History:  Past Medical History:  Diagnosis Date  . 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 Depression   . Vertigo    History reviewed. No pertinent surgical history. Family History:  Family History  Problem Relation Age of Onset  . Adopted: Yes   Social History:  History  Alcohol Use No     History  Drug Use No    Social History   Social History  . Marital status: Married    Spouse name: N/A  . Number of children: N/A  . Years of education: N/A   Social History Main Topics  . Smoking status: Never Smoker  . Smokeless tobacco: Never Used  . Alcohol use No  . Drug use: No  . Sexual activity: Not Currently   Other Topics Concern  . None   Social History Narrative   The patient did not know her biological parents. She was was adopted and does not know her biological parents. Her adopted father died 3 years ago. She had a very close relationship with both parents and denies any history of any physical or sexual abuse. 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 She has been married for 4 years and has one 42-year-old daughter and then one stepson age 58. Her stepson spends half her time with her and her husband and half of the time with his biological mother. Her husband works as a Investment banker, operational. She denies any marital conflict.          Hospital Course:    [name] is a 30 year old female with a history of depression admitted for worsening of her symptoms and aborted suicide attempt.  1. Suicidal ideation. The patient is able to contract for safety in the hospital.  2. Mood. The patient was maintained on a combination of Abilify, Zoloft, and Wellbutrin. We discontinued Wellbutrin and Abilify, keep Zoloft, and start Trileptal and Geodon for mood stabilization.  3. Metabolic syndrome monitoring. Lipid profile, TSH, hemoglobin A1c, are normal, prolactin 10.5.   4. Insomnia.  Trazodone is available.  5. Disposition. She will return to home with her husband. She will follow up with Dr. Maryruth Bun for medication management and her therapist.  Physical Findings: AIMS: Facial and Oral Movements Muscles of Facial Expression: None, normal Lips and Perioral Area: None, normal Jaw: None, normal Tongue: None, normal,Extremity Movements Upper (arms, wrists, hands, fingers): None, normal Lower (legs, knees, ankles, toes): None, normal, Trunk Movements Neck, shoulders, hips: None, normal, Overall Severity Severity of abnormal movements (highest score from questions above): None, normal Incapacitation due to abnormal movements: None, normal Patient's awareness of abnormal movements (rate only patient's report): No Awareness, Dental Status Current problems with teeth and/or dentures?: Yes Does patient usually wear dentures?: Yes  CIWA:    COWS:     Musculoskeletal: Strength & Muscle Tone: within normal limits Gait & Station: normal Patient leans: N/A  Psychiatric Specialty Exam: Physical Exam  Nursing note and vitals reviewed.   Review of Systems  Psychiatric/Behavioral: The patient is nervous/anxious.     Blood pressure 111/78, pulse 78, temperature 98 F (36.7 C), temperature source Oral, resp. rate 18, height 5\' 1"  (1.549 m), weight 67.6 kg (149 lb), last menstrual period 07/07/2016, SpO2 98 %.Body mass index is 28.15 kg/m.  See SRA.                                                  Sleep:  Number of Hours: 8     Have you used any form of tobacco in the last 30 days? (Cigarettes, Smokeless Tobacco, Cigars, and/or Pipes): No  Has this patient used any form of tobacco in the last 30 days? (Cigarettes, Smokeless Tobacco, Cigars, and/or Pipes) Yes, No  Blood Alcohol level:  Lab Results  Component Value Date   ETH <5 07/19/2016    Metabolic Disorder Labs:  Lab Results  Component Value Date   HGBA1C 5.0 07/20/2016   Lab Results   Component Value Date   PROLACTIN 10.8 07/20/2016   Lab Results  Component Value Date   CHOL 203 (H) 07/19/2016   TRIG 74 07/19/2016   HDL 52 07/19/2016   CHOLHDL 3.9 07/19/2016   VLDL 15 07/19/2016   LDLCALC 136 (H) 07/19/2016   LDLCALC 112 (H) 05/31/2015    See Psychiatric Specialty Exam and Suicide Risk Assessment completed by Attending Physician prior to discharge.  Discharge destination:  Home  Is patient on multiple antipsychotic therapies at discharge:  No   Has Patient had three or more failed trials of antipsychotic monotherapy by history:  No  Recommended Plan for Multiple Antipsychotic Therapies: NA  Discharge Instructions    Diet - low sodium heart healthy    Complete by:  As directed   Increase activity slowly    Complete by:  As directed       Medication List    STOP taking these medications   ARIPiprazole 2 MG tablet Commonly known as:  ABILIFY   buPROPion 150 MG 24 hr tablet Commonly known as:  WELLBUTRIN XL     TAKE these medications     Indication  Levonorgestrel-Ethinyl Estradiol 0.15-0.03 &0.01 MG tablet Commonly known as:  AMETHIA,CAMRESE Take 1 tablet by mouth daily.  Indication:  Birth Control   OXcarbazepine 150 MG tablet Commonly known as:  TRILEPTAL Take 1 tablet (150 mg total) by mouth 2 (two) times daily.  Indication:  Manic-Depression   sertraline 100 MG tablet Commonly known as:  ZOLOFT Take 100-150 mg by mouth daily.  Indication:  Major Depressive Disorder   traZODone 100 MG tablet Commonly known as:  DESYREL Take 1 tablet (100 mg total) by mouth at bedtime.  Indication:  Trouble Sleeping   Vitamin D (Ergocalciferol) 50000 units Caps capsule Commonly known as:  DRISDOL Take 1 capsule (50,000 Units total) by mouth every 7 (seven) days.  Indication:  Vitamin D Deficiency   ziprasidone 40 MG capsule Commonly known as:  GEODON Take 1 capsule (40 mg total) by mouth 2 (two) times daily with a meal.  Indication:   Manic-Depression      Follow-up Information    Dr. Benjie KarvonenKupar's Office. Go on 07/27/2016.   Why:  Please arrive to your psychiatry appointment with Dr. Bethanie DickerKupar August 17th at 12:30PM. Please bring your discharge paperwork for medication adjustments and progress notes. Please arrive 15 minutes early for prompt services. Contact information: [address] [place], [place] [address] Ph #: [phone] Fax #: [phone]          Follow-up recommendations:  Activity:  As tolerated. Diet:  Low sodium heart healthy. Other:  Keep follow-up appointments.  Comments:   Signed: Kristine Linea, MD 07/24/2016, 1:32 PM

## 2016-07-24 NOTE — Progress Notes (Signed)
D: Observed pt visiting with family. Patient alert and oriented x4. Patient denies SI/HI/AVH. Pt affect is appropriate and brightening. Pt stated her day was "really good.Marland Kitchen.Marland Kitchen.I woke up happy for the first time in a long time." Pt stated her goal was to have a positive attitude. Pt rated her depression 2/10 and anxiety 0/10.  A: Offered active listening and support. Provided therapeutic communication. Administered scheduled medications. Encouraged pt to continue attending group.  R: Pt pleasant and cooperative. Pt medication compliant. Will continue Q15 min. checks. Safety maintained.

## 2016-07-24 NOTE — Plan of Care (Signed)
Problem: Education: Goal: Mental status will improve Outcome: Progressing Pt affect is brighter, pt interactive, and rated depression 2/10.

## 2016-07-24 NOTE — Progress Notes (Signed)
Recreation Therapy Notes  INPATIENT RECREATION TR PLAN  Patient Details Name: Mary Andersen MRN: [id number] DOB: [birth year] Today's Date: 07/24/2016  Rec Therapy Plan Is patient appropriate for Therapeutic Recreation?: Yes Treatment times per week: At least once a week TR Treatment/Interventions: 1:1 session, Group participation (Comment) (Appropriate participation in daily recreational therapy tx)  Discharge Criteria Pt will be discharged from therapy if:: Treatment goals are met, Discharged Treatment plan/goals/alternatives discussed and agreed upon by:: Patient/family  Discharge Summary Short term goals set: See Care Plan Short term goals met: Complete, Adequate for discharge Progress toward goals comments: One-to-one attended Which groups?: Wellness, Coping skills, Leisure education One-to-one attended: Self-esteem, stress management Reason goals not met: Patient d/c before goal was met Therapeutic equipment acquired: None Reason patient discharged from therapy: Discharge from hospital Pt/family agrees with progress & goals achieved: Yes Date patient discharged from therapy: 07/24/16   Leonette Monarch, LRT/CTRS 07/24/2016, 3:18 PM

## 2016-07-24 NOTE — [facility] Group Notes (Signed)
[facility] Group Notes:  (Nursing/MHT/Case Management/Adjunct)  Date:  07/24/2016  Time:  2:45 AM  Type of Therapy:  Group Therapy  Participation Level:  Active  Participation Quality:  Appropriate  Affect:  Appropriate  Cognitive:  Appropriate  Insight:  Appropriate  Engagement in Group:  Engaged  Modes of Intervention:  n/a  Summary of Progress/Problems:  Mary Andersen 07/24/2016, 2:45 AM

## 2016-07-24 NOTE — [facility] Suicide Risk Assessment (Signed)
[facility]   Principal Problem: Bipolar I disorder, most recent episode mixed [facility](HCC) Discharge Diagnoses:  Patient Active Problem List   Diagnosis Date Noted  . Bipolar I disorder, most recent episode mixed (HCC) [F31.60] 07/24/2016    Priority: High  . Major depressive disorder, recurrent, with postpartum onset (HCC) [Z61.096[O99.345, F33.9] 07/19/2016    Priority: High  . Suicidal ideation [R45.851] 07/19/2016  . Fatigue [R53.83] 02/10/2016  . Attention and concentration deficit [R41.840] 06/30/2015  . Depression [F32.9] 05/08/2014  . 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 Vertigo [R42] 04/29/2012    Total Time spent with patient: 30 minutes  Musculoskeletal: Strength & Muscle Tone: within normal limits Gait & Station: normal Patient leans: N/A  Psychiatric Specialty Exam: Review of Systems  Psychiatric/Behavioral: The patient is nervous/anxious.   All other systems reviewed and are negative.   Blood pressure 111/78, pulse 78, temperature 98 F (36.7 C), temperature source Oral, resp. rate 18, height 5\' 1"  (1.549 m), weight 67.6 kg (149 lb), last menstrual period 07/07/2016, SpO2 98 %.Body mass index is 28.15 kg/m.  General Appearance: Casual  Eye Contact::  Good  Speech:  Clear and Coherent409  Volume:  Normal  Mood:  Euthymic  Affect:  Appropriate  Thought Process:  Goal Directed  Orientation:  Full (Time, Place, and Person)  Thought Content:  WDL  Suicidal Thoughts:  No  Homicidal Thoughts:  No  Memory:  Immediate;   Fair Recent;   Fair Remote;   Fair  Judgement:  Impaired  Insight:  Shallow  Psychomotor Activity:  Normal  Concentration:  Fair  Recall:  Fis[employer]Fair  Fund of Knowledge:Fair  Language: Fair  Akathisia:  No  Handed:  Right  AIMS (if indicated):     Assets:  Communication Skills Desire for Improvement Financial Resources/Insurance Housing Intimacy Physical Health Resilience Social  Support Talents/Skills Transportation Vocational/Educational  Sleep:  Number of Hours: 8  Cognition: WNL  ADL's:  Intact   Mental Status Per Nursing Assessment::   On Admission:     Demographic Factors:  Caucasian  Loss Factors: NA  Historical Factors: Impulsivity  Risk Reduction Factors:   Responsible for children under 31 years of age, Sense of responsibility to family, Employed, Living with another person, especially a relative, Positive social support and Positive therapeutic relationship  Continued Clinical Symptoms:  Bipolar Disorder:   Mixed State  Cognitive Features That Contribute To Risk:  None    Suicide Risk:  Minimal: No identifiable suicidal ideation.  Patients presenting with no risk factors but with morbid ruminations; may be classified as minimal risk based on the severity of the depressive symptoms  Follow-up Information    Dr. Benjie KarvonenKupar's Office. Go on 07/27/2016.   Why:  Please arrive to your psychiatry appointment with Dr. Bethanie DickerKupar August 17th at 12:30PM. Please bring your discharge paperwork for medication adjustments and progress notes. Please arrive 15 minutes early for prompt services. Contact information: [address] [place], [place] [address] Ph #: [phone] Fax #: [phone]          Plan Of Care/Follow-up recommendations:  Activity:  As tolerated. Diet:  Low sodium heart healthy. Other:  Keep follow-up appointments.  Kristine LineaJolanta Lafe Clerk, MD 07/24/2016, 1:11 PM

## 2016-07-25 NOTE — Progress Notes (Signed)
  [facility] Dis[facility] : Late entry   Will you be returning to the same living situation after discharge:  Yes,  home with husband At discharge, do you have transportation home?: Yes,  husband will pick pt up. Do you have the ability to pay for your medications: Yes,  insurance coverage  Release of information consent forms completed and in the chart;  Patient's signature needed at discharge.  Patient to Follow up at: Follow-up Information    Dr. Shelda AltesKapur's Office. Go on 07/27/2016.   Why:  Please arrive to your psychiatry appointment with Dr. Bethanie DickerKupar August 17th at 12:30PM for medication management and therapy. Please bring your discharge paperwork for medication adjustments and progress notes. Contact information: [address] [place], [place] [address] Ph #: [phone] Fax #: [phone]          Next level of care provider has access to [facility] Link:yes  Safety Planning and Suicide Prevention discussed: Yes,  SPE completed with pt and husband  Have you used any form of tobacco in the last 30 days? (Cigarettes, Smokeless Tobacco, Cigars, and/or Pipes): No  Has patient been referred to the Quitline?: N/A patient is not a smoker  Patient has been referred for addiction treatment: N/A  Lynden OxfordKadijah R Saraiah Bhat, MSW, LCSW-A 07/25/2016, 9:09 AM

## 2016-12-18 ENCOUNTER — Encounter: Payer: Self-pay | Admitting: Family Medicine

## 2016-12-18 ENCOUNTER — Ambulatory Visit (INDEPENDENT_AMBULATORY_CARE_PROVIDER_SITE_OTHER): Payer: BLUE CROSS/BLUE SHIELD | Admitting: Family Medicine

## 2016-12-18 VITALS — BP 100/76 | HR 78 | Temp 98.3°F | Ht 61.0 in | Wt 159.2 lb

## 2016-12-18 DIAGNOSIS — M545 Low back pain, unspecified: Secondary | ICD-10-CM

## 2016-12-18 DIAGNOSIS — M79641 Pain in right hand: Secondary | ICD-10-CM | POA: Diagnosis not present

## 2016-12-18 DIAGNOSIS — G8929 Other chronic pain: Secondary | ICD-10-CM

## 2016-12-18 NOTE — Progress Notes (Signed)
Pre visit review using our clinic review tool, if applicable. No additional management support is needed unless otherwise documented below in the visit note. 

## 2016-12-18 NOTE — Progress Notes (Signed)
HPI:  Mary DingwallJessica Andersen is a pleasant 31 year old here for an acute visit for right hand and wrist pain. She reports a confirmed diagnosis of mild carpal tunnel. She uses her right hand a lot and she does not have a left hand. She reports she has had worsening pain discomfort in the right thenar eminence over the last year. This can be particularly painful at night. She has a wrist brace, but it is not comfortable and she has not worn it. No  malaise or fevers. She does have some tingling in the right thumb and index finger at times. She sometimes feels there may be some mild weakness in his hand as well. She also has chronic left-sided low back pain. As mentioned at the end of the visit. She denies radiation, weakness, numbness, bowel or bladder incontinence.   ROS: See pertinent positives and negatives per HPI.  Past Medical History:  Diagnosis Date  . 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 Depression   . Vertigo     No past surgical history on file.  Family History  Problem Relation Age of Onset  . Adopted: Yes    Social History   Social History  . Marital status: Married    Spouse name: N/A  . Number of children: N/A  . Years of education: N/A   Social History Main Topics  . Smoking status: Never Smoker  . Smokeless tobacco: Never Used  . Alcohol use No  . Drug use: No  . Sexual activity: Not Currently   Other Topics Concern  . None   Social History Narrative   The patient did not know her biological parents. She was was adopted and does not know her biological parents. Her adopted father died 3 years ago. She had a very close relationship with both parents and denies any history of any physical or sexual abuse. 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 She has been married for 4 years and has one 32-year-old daughter and then one stepson age 32. Her stepson spends half her  time with her and her husband and half of the time with his biological mother. Her husband works as a Investment banker, operationalchef. She denies any marital conflict.           Current Outpatient Prescriptions:  .  citalopram (CELEXA) 20 MG tablet, Take 20 mg by mouth daily., Disp: , Rfl: 2 .  divalproex (DEPAKOTE) 500 MG DR tablet, Take 500 mg by mouth 2 (two) times daily., Disp: , Rfl: 2  EXAM:  Vitals:   12/18/16 1521  BP: 100/76  Pulse: 78  Temp: 98.3 F (36.8 C)    Body mass index is 30.08 kg/m.  GENERAL: vitals reviewed and listed above, alert, oriented, appears well hydrated and in no acute distress  HEENT: atraumatic, conjunttiva clear, no obvious abnormalities on inspection of external nose and ears  NECK: no obvious masses on inspection  LUNGS: clear to auscultation bilaterally, no wheezes, rales or rhonchi, good air movement  CV: HRRR, no peripheral edema  MS: moves all extremities without noticeable abnormality; normal inspection of the right hand, wrist, forearm and arm. She has some mild tenderness to palpation in the thenar eminence. She has fairly good strength in the digits, wrist and forearm. Mildly positive Phalen's. Negative Tinel's.NV intact in R hand. 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 -follow up with PCP in one month -of course, we advised Mary Andersen  to return or notify a doctor  immediately if symptoms worsen or persist or new concerns arise.  Chronic left-sided low back pain without sciatica -mentioned at the end of a lengthy visit -brief exam suggest most likely musculoskeletal without alarm features  - has 32 yo and may be from carrying her -opted to start with some low back home exercises, heat and OTC analgesic only if needed -follow up with PCP in 1 month  -Patient advised to return or notify a doctor immediately if symptoms worsen or persist or new concerns arise.  Patient Instructions  BEFORE YOU LEAVE: -low back exercises -follow up: with your doctor in 1 month  For the Right hand/wrist pain: -get the [facility] Perfect Fit wrist support and wear every night - do not apply to tightly -use duing the day as well when possible  For the back: -do the exercises 4 days per week -heat and topical over the counter sport cream (with capsaicin or menthol) can be helpful  Ibuprofen or tylenol per instructions as needed for pain.  Follow up sooner if worsening or new concerns.   Mary Basque R., DO

## 2016-12-18 NOTE — Patient Instructions (Signed)
BEFORE YOU LEAVE: -low back exercises -follow up: with your doctor in 1 month  For the Right hand/wrist pain: -get the [facility] Perfect Fit wrist support and wear every night - do not apply to tightly -use duing the day as well when possible  For the back: -do the exercises 4 days per week -heat and topical over the counter sport cream (with capsaicin or menthol) can be helpful  Ibuprofen or tylenol per instructions as needed for pain.  Follow up sooner if worsening or new concerns.

## 2017-01-05 ENCOUNTER — Telehealth: Payer: Self-pay

## 2017-01-05 MED ORDER — OSELTAMIVIR PHOSPHATE 75 MG PO CAPS
75.0000 mg | ORAL_CAPSULE | Freq: Every day | ORAL | 0 refills | Status: DC
Start: 2017-01-05 — End: 2017-07-11

## 2017-01-05 NOTE — Telephone Encounter (Signed)
Mr Mary Andersen came back in for update and notified as instructed. Mr Mary Andersen voiced understanding.

## 2017-01-05 NOTE — Telephone Encounter (Signed)
Noted.  I hope they all feel better soon.  eRx for preventive dose tamiflu sent to pharmacy on file.

## 2017-01-05 NOTE — Telephone Encounter (Signed)
pts husband came in to office; pts daughter and husband have the flu and Mr [name] request [facility] [name]. Pt does not have any symptoms at this time. Mr [name] request cb.

## 2017-07-11 ENCOUNTER — Ambulatory Visit (INDEPENDENT_AMBULATORY_CARE_PROVIDER_SITE_OTHER): Payer: BLUE CROSS/BLUE SHIELD | Admitting: Family Medicine

## 2017-07-11 ENCOUNTER — Encounter: Payer: Self-pay | Admitting: Family Medicine

## 2017-07-11 ENCOUNTER — Encounter (INDEPENDENT_AMBULATORY_CARE_PROVIDER_SITE_OTHER): Payer: Self-pay

## 2017-07-11 VITALS — BP 118/80 | HR 68 | Ht 61.0 in | Wt 158.0 lb

## 2017-07-11 DIAGNOSIS — Z309 Encounter for contraceptive management, unspecified: Secondary | ICD-10-CM | POA: Insufficient documentation

## 2017-07-11 DIAGNOSIS — Z30011 Encounter for initial prescription of contraceptive pills: Secondary | ICD-10-CM | POA: Diagnosis not present

## 2017-07-11 DIAGNOSIS — Z30019 Encounter for initial prescription of contraceptives, unspecified: Secondary | ICD-10-CM | POA: Diagnosis not present

## 2017-07-11 LAB — POCT URINE PREGNANCY: Preg Test, Ur: NEGATIVE

## 2017-07-11 MED ORDER — NORETHINDRONE ACET-ETHINYL EST 1-20 MG-MCG PO TABS: 1.0000 | ORAL_TABLET | Freq: Every day | ORAL | 11 refills | Status: DC

## 2017-07-11 NOTE — Assessment & Plan Note (Signed)
>  15 minutes spent in face to face time with patient, >50% spent in counselling or coordination of care. Education given regarding options for contraception, including barrier methods, injectable contraception, IUD placement, oral contraceptives. She would like to try OCPs again. eRx sent for loestrin.  She will update me in a few months.

## 2017-07-11 NOTE — Progress Notes (Signed)
Subjective:   Patient ID: Mary DingwallJessica Andersen, female    DOB: 1986/12/03, 31 y.o.   MRN: [id number]  Mary DingwallJessica Andersen is a pleasant 31 y.o. year old female who presents to clinic today with Contraception (prefers pill )  on 07/11/2017  HPI:  Contraception management- going through a divorce.  Ex husband had a vasectomy so she has not needed birth control since before she was pregnant with her almost 32 year old daughter.  Previously on OCPs.  Cannot remember which ones.  Had no issues with them.   Current Outpatient Prescriptions on File Prior to Visit  Medication Sig Dispense Refill  . citalopram (CELEXA) 20 MG tablet Take 20 mg by mouth daily.  2  . divalproex (DEPAKOTE) 500 MG DR tablet Take 500 mg by mouth 2 (two) times daily.  2   No current facility-administered medications on file prior to visit.     No Known Allergies  Past Medical History:  Diagnosis Date  . 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 Depression   . Vertigo     No past surgical history on file.  Family History  Problem Relation Age of Onset  . Adopted: Yes    Social History   Social History  . Marital status: Married    Spouse name: N/A  . Number of children: N/A  . Years of education: N/A   Occupational History  . Not on file.   Social History Main Topics  . Smoking status: Never Smoker  . Smokeless tobacco: Never Used  . Alcohol use No  . Drug use: No  . Sexual activity: Not Currently   Other Topics Concern  . Not on file   Social History Narrative   The patient did not know her biological parents. She was was adopted and does not know her biological parents. Her adopted father died 3 years ago. She had a very close relationship with both parents and denies any history of any physical or sexual abuse. 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 She has been married for 4 years and has one  32-year-old daughter and then one stepson age 32. Her stepson spends half her time with her and her husband and half of the time with his biological mother. Her husband works as a Investment banker, operationalchef. She denies any marital conflict.         The PMH, PSH, Social History, Family History, Medications, and allergies [facility], and have been updated if relevant.   Review of Systems  Genitourinary: Negative.   All other systems reviewed and are negative.      Objective:    BP 118/80   Pulse 68   Ht 5\' 1"  (1.549 m)   Wt 158 lb (71.7 kg)   LMP 03/11/2017   SpO2 95%   BMI 29.85 kg/m    Physical Exam   General:  Well-developed,well-nourished,in no acute distress; alert,appropriate and cooperative throughout examination Head:  normocephalic and atraumatic.   Eyes:  vision grossly intact Ears:  R ear normal and L ear normal externally Nose:  no external deformity.   Mouth:  good dentition.   Lungs:  Normal respiratory effort, chest expands symmetrically. Lungs are clear to auscultation, no crackles or wheezes. Heart:  Normal rate and regular rhythm.  Neurologic:  alert & oriented X3 and gait normal.   Skin:  Intact without suspicious lesions or  rashes Psych:  Cognition and judgment appear intact. Alert and cooperative with normal attention span and concentration. No apparent delusions, illusions, hallucinations       Assessment & Plan:   Encounter for initial prescription of contraceptives, unspecified contraceptive - Plan: POCT urine pregnancy No Follow-up on file.

## 2018-01-02 ENCOUNTER — Telehealth: Payer: Self-pay | Admitting: Family Medicine

## 2018-01-02 ENCOUNTER — Other Ambulatory Visit: Payer: Self-pay

## 2018-01-02 MED ORDER — NORETHINDRONE ACET-ETHINYL EST 1-20 MG-MCG PO TABS: 1.0000 | ORAL_TABLET | Freq: Every day | ORAL | 11 refills | Status: AC

## 2018-01-02 NOTE — Telephone Encounter (Signed)
I have refaxed the Rx to the pharmacy/thx dmf

## 2018-01-02 NOTE — Telephone Encounter (Signed)
Copied from CRM [phone]. Topic: Quick Communication - Rx Refill/Question >> Jan 02, 2018  9:29 AM Louie BunPalacios Medina, Rosey Batheresa D wrote: Medication: norethindrone-ethinyl estradiol (LOESTRIN 1/20, 21,) 1-20 MG-MCG tablet   Has the patient contacted their pharmacy?Yes   (Agent: If no, request that the patient contact the pharmacy for the refill.)   Preferred Pharmacy (with phone number or street name): [id number] [address], [place] - [phone]   Agent: Please be advised that RX refills may take up to 3 business days. We ask that you follow-up with your pharmacy.

## 2018-01-02 NOTE — Progress Notes (Signed)
Refaxed/pharm not rec/thx dmf 

## 2018-06-25 ENCOUNTER — Encounter: Payer: Self-pay | Admitting: Family Medicine

## 2018-09-17 ENCOUNTER — Ambulatory Visit: Payer: Self-pay | Admitting: Internal Medicine

## 2020-05-15 ENCOUNTER — Other Ambulatory Visit: Payer: Self-pay

## 2020-05-15 ENCOUNTER — Emergency Department: Payer: BC Managed Care – PPO

## 2020-05-15 ENCOUNTER — Emergency Department
Admission: EM | Admit: 2020-05-15 | Discharge: 2020-05-15 | Disposition: A | Discharge status: Home / Self Care | Payer: BC Managed Care – PPO | Attending: Emergency Medicine | Admitting: Emergency Medicine

## 2020-05-15 DIAGNOSIS — R200 Anesthesia of skin: Secondary | ICD-10-CM | POA: Insufficient documentation

## 2020-05-15 DIAGNOSIS — Y9352 Activity, horseback riding: Secondary | ICD-10-CM | POA: Insufficient documentation

## 2020-05-15 DIAGNOSIS — Y92838 Other recreation area as the place of occurrence of the external cause: Secondary | ICD-10-CM | POA: Insufficient documentation

## 2020-05-15 DIAGNOSIS — Y998 Other external cause status: Secondary | ICD-10-CM | POA: Diagnosis not present

## 2020-05-15 DIAGNOSIS — R202 Paresthesia of skin: Secondary | ICD-10-CM | POA: Diagnosis not present

## 2020-05-15 DIAGNOSIS — S300XXA Contusion of lower back and pelvis, initial encounter: Secondary | ICD-10-CM | POA: Insufficient documentation

## 2020-05-15 DIAGNOSIS — S3992XA Unspecified injury of lower back, initial encounter: Secondary | ICD-10-CM | POA: Diagnosis present

## 2020-05-15 MED ORDER — ONDANSETRON HCL 4 MG/2ML IJ SOLN
4.0000 mg | Freq: Once | INTRAMUSCULAR | Status: AC
  Administered 2020-05-15: 4 mg via INTRAVENOUS
  Filled 2020-05-15: qty 2

## 2020-05-15 MED ORDER — LIDOCAINE 5 % EX PTCH: 1.0000 | MEDICATED_PATCH | Freq: Two times a day (BID) | CUTANEOUS | 0 refills | Status: AC

## 2020-05-15 MED ORDER — CYCLOBENZAPRINE HCL 10 MG PO TABS: 10.0000 mg | ORAL_TABLET | Freq: Three times a day (TID) | ORAL | 0 refills | Status: DC | PRN

## 2020-05-15 MED ORDER — OXYCODONE-ACETAMINOPHEN 7.5-325 MG PO TABS: 1.0000 | ORAL_TABLET | Freq: Four times a day (QID) | ORAL | 0 refills | Status: DC | PRN

## 2020-05-15 MED ORDER — HYDROMORPHONE HCL 1 MG/ML IJ SOLN
1.0000 mg | Freq: Once | INTRAMUSCULAR | Status: AC
  Administered 2020-05-15: 1 mg via INTRAVENOUS
  Filled 2020-05-15: qty 1

## 2020-05-15 MED ORDER — LIDOCAINE 5 % EX PTCH
1.0000 | MEDICATED_PATCH | CUTANEOUS | Status: DC
  Administered 2020-05-15: 1 via TRANSDERMAL
  Filled 2020-05-15: qty 1

## 2020-05-15 MED ORDER — IBUPROFEN 600 MG PO TABS: 600.0000 mg | ORAL_TABLET | Freq: Three times a day (TID) | ORAL | 0 refills | Status: AC | PRN

## 2020-05-15 NOTE — Discharge Instructions (Signed)
Your x-rays were negative for any fracture.  Coccyx contusion is painful for the next 3 to 5 days.  It would take approximately 3 to 4 weeks to be completely free of pain.  Follow discharge care instructions and take medication as directed.

## 2020-05-15 NOTE — ED Provider Notes (Signed)
[facility] Emergency Department Provider Note   ____________________________________________   First MD Initiated Contact with Patient 05/15/20 1153     (approximate)  I have reviewed the triage vital signs and the nursing notes.   HISTORY  Chief Complaint Fall    HPI Mary Andersen is a 34 y.o. female patient complain of coccyx and bilateral hip pain secondary to fall from horse prior to arrival.  Patient denies LOC or head injury.  Patient denies neck, upper back, chest or abdominal pain.  Patient states she landed on her coccyx and left side.  Patient arrived EMS complaining of numbness and tingling to bilateral lower extremities.  Patient denies bladder or bowel discomfort.  Patient arrived via EMS was given fentanyl in route.  Patient initially rated her pain as a 9/10.  Patient currently rates pain as a 5/10.  Prescribed pain is "achy".       Past Medical History:  Diagnosis Date  . 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 Depression   . Vertigo     Patient Active Problem List   Diagnosis Date Noted  . Contraception management 07/11/2017  . Bipolar I disorder, most recent episode mixed (HCC) 07/24/2016  . Major depressive disorder, recurrent, with postpartum onset (HCC) 07/19/2016  . Suicidal ideation 07/19/2016  . Fatigue 02/10/2016  . Attention and concentration deficit 06/30/2015  . Depression 05/08/2014  . 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 Vertigo 04/29/2012    No past surgical history on file.  Prior to Admission medications   Medication Sig Start Date End Date Taking? Authorizing Provider  citalopram (CELEXA) 20 MG tablet Take 20 mg by mouth daily. 12/08/16   [provider]  cyclobenzaprine (FLEXERIL) 10 MG tablet Take 1 tablet (10 mg total) by mouth 3 (three) times daily as needed. 05/15/20   Joni Reining, PA-C  divalproex (DEPAKOTE) 500 MG DR tablet Take 500 mg by mouth 2 (two)  times daily. 12/08/16   [provider]  ibuprofen (ADVIL) 600 MG tablet Take 1 tablet (600 mg total) by mouth every 8 (eight) hours as needed. 05/15/20   Joni Reining, PA-C  lidocaine (LIDODERM) 5 % Place 1 patch onto the skin every 12 (twelve) hours. Remove & Discard patch within 12 hours or as directed by MD 05/15/20 05/15/21  Joni Reining, PA-C  norethindrone-ethinyl estradiol (LOESTRIN 1/20, 21,) 1-20 MG-MCG tablet Take 1 tablet by mouth daily. 01/02/18   Dianne Dun, MD  oxyCODONE-acetaminophen (PERCOCET) 7.5-325 MG tablet Take 1 tablet by mouth every 6 (six) hours as needed. 05/15/20   Joni Reining, PA-C    Allergies Patient has no known allergies.  Family History  Adopted: Yes    Social History Social History   Tobacco Use  . Smoking status: Never Smoker  . Smokeless tobacco: Never Used  Substance Use Topics  . Alcohol use: Yes    Alcohol/week: 0.0 standard drinks  . Drug use: No    Review of Systems Constitutional: No fever/chills Eyes: No visual changes. ENT: No sore throat. Cardiovascular: Denies chest pain. Respiratory: Denies shortness of breath. Gastrointestinal: No abdominal pain.  No nausea, no vomiting.  No diarrhea.  No constipation. Genitourinary: Negative for dysuria. Musculoskeletal: Congenital absence of left forearm, coccyx and bilateral hip pain Skin: Negative for rash. Neurological: Negative for headaches, focal weakness or numbness.   ____________________________________________   PHYSICAL EXAM:  VITAL SIGNS: ED Triage Vitals  Enc Vitals Group  BP 05/15/20 1148 136/78     Pulse Rate 05/15/20 1148 84     Resp 05/15/20 1148 18     Temp 05/15/20 1148 98.2 F (36.8 C)     Temp Source 05/15/20 1148 Oral     SpO2 05/15/20 1148 98 %     Weight 05/15/20 1143 170 lb (77.1 kg)     Height 05/15/20 1143 5\' 1"  (1.549 m)     Head Circumference --      Peak Flow --      Pain Score 05/15/20 1143 9     Pain Loc --      Pain Edu? --        Excl. in GC? --    Constitutional: Alert and oriented. Well appearing and in no acute distress. Eyes: Conjunctivae are normal. PERRL. EOMI. Head: Atraumatic. Nose: No congestion/rhinnorhea. Mouth/Throat: Mucous membranes are moist.  Oropharynx non-erythematous. Neck:No cervical spine tenderness to palpation. Hematological/Lymphatic/Immunilogical: No cervical lymphadenopathy. Deferred cardiovascular: Normal rate, regular rhythm. Grossly normal heart sounds.  Good peripheral circulation. Respiratory: Normal respiratory effort.  No retractions. Lungs CTAB. Gastrointestinal: Soft and nontender. No distention. No abdominal bruits. No CVA tenderness. Genitourinary:  Musculoskeletal: No obvious deformity of the lower extremities.  Patient has moderate guarding palpation bilateral hip and coccyx area Neurologic:  Normal speech and language. No gross focal neurologic deficits are appreciated. No gait instability. Skin:  Skin is warm, dry and intact. No rash noted. Psychiatric: Mood and affect are normal. Speech and behavior are normal.  ____________________________________________   LABS (all labs ordered are listed, but only abnormal results are displayed)  Labs Reviewed  POC URINE PREG, ED   ____________________________________________  EKG   ____________________________________________  RADIOLOGY  ED MD interpretation:    Official radiology report(s): DG Sacrum/Coccyx  Result Date: 05/15/2020 CLINICAL DATA:  Larey Seat off a horse landing in seated position, tailbone pain, pain radiating down both legs EXAM: SACRUM AND COCCYX - 2+ VIEW COMPARISON:  None FINDINGS: Osseous mineralization normal. Hip and SI joint spaces preserved. No definite sacrococcygeal fracture identified. IMPRESSION: No acute osseous abnormalities. Electronically Signed   By: Ulyses Southward M.D.   On: 05/15/2020 14:15   DG Hip Unilat W or Wo Pelvis 2-3 Views Left  Result Date: 05/15/2020 CLINICAL DATA:  Larey Seat off a  horse landing in seated position, tailbone pain, pain radiating down both legs EXAM: DG HIP (WITH OR WITHOUT PELVIS) 2-3V LEFT COMPARISON:  None FINDINGS: Slight rotation to the RIGHT on pelvic radiograph. Hip and SI joint spaces preserved. No fracture, dislocation, or bone destruction. IMPRESSION: Normal exam. Electronically Signed   By: Ulyses Southward M.D.   On: 05/15/2020 14:15   DG Hip Unilat W or Wo Pelvis 2-3 Views Right  Result Date: 05/15/2020 CLINICAL DATA:  Larey Seat off a horse landing in seated position, tailbone pain, pain radiating down both legs EXAM: DG HIP (WITH OR WITHOUT PELVIS) 2-3V RIGHT COMPARISON:  None. FINDINGS: Osseous mineralization normal. Joint spaces preserved. No fracture, dislocation, or bone destruction. IMPRESSION: Normal exam. Electronically Signed   By: Ulyses Southward M.D.   On: 05/15/2020 14:17    ____________________________________________   PROCEDURES  Procedure(s) performed (including Critical Care):  Procedures   ____________________________________________   INITIAL IMPRESSION / ASSESSMENT AND PLAN / ED COURSE  As part of my medical decision making, I reviewed the following data within the electronic MEDICAL RECORD NUMBER   Patient presents with constant bilateral hip pain secondary to fall horse.  No LOC or head injury.  Discussed negative x-ray findings of the coccyx and bilateral hip.  Discussed sequela of coccyx contusion.  Patient given discharge care instruction advised take medication as directed.  Patient vies follow-up PCP.     Trenyce Kammerdiener was evaluated in Emergency Department on 05/15/2020 for the symptoms described in the history of present illness. She was evaluated in the context of the global COVID-19 pandemic, which necessitated consideration that the patient might be at risk for infection with the SARS-CoV-2 virus that causes COVID-19. Institutional protocols and algorithms that pertain to the evaluation of patients at risk for COVID-19 are in a  state of rapid change based on information released by regulatory bodies including the CDC and federal and state organizations. These policies and algorithms were followed during the patient's care in the ED.       ____________________________________________   FINAL CLINICAL IMPRESSION(S) / ED DIAGNOSES  Final diagnoses:  Coccyx contusion, initial encounter     ED Discharge Orders         Ordered    oxyCODONE-acetaminophen (PERCOCET) 7.5-325 MG tablet  Every 6 hours PRN     05/15/20 1430    cyclobenzaprine (FLEXERIL) 10 MG tablet  3 times daily PRN     05/15/20 1430    ibuprofen (ADVIL) 600 MG tablet  Every 8 hours PRN     05/15/20 1430    lidocaine (LIDODERM) 5 %  Every 12 hours     05/15/20 1431           Note:  This document was prepared using Dragon voice recognition software and may include unintentional dictation errors.    Joni Reining, PA-C 05/15/20 1437    Dionne Bucy, MD 05/15/20 1620

## 2020-05-15 NOTE — ED Triage Notes (Signed)
Pt here via [facility] after falling off her horse today.   Pt reports she fell off the side and landed in a seated position, denies LOC or hitting head. Pt currently reporting tailbone pain. Per EMS at one point pt reported that her left foot "fell asleep", this was corrected after moving her leg. Pt reports numbness and tingling in both feet now but more so the left foot.   Pt received NS, 100 fentanyl, and 8mg  zofran with EMS.

## 2021-04-01 IMAGING — CR DG HIP (WITH OR WITHOUT PELVIS) 2-3V*R*
3 series · 3 of 3 positions shown · non-contrast
Comparison: None.

CLINICAL DATA: Fell off a horse landing in seated position,
tailbone pain, pain radiating down both legs

EXAM:
DG HIP (WITH OR WITHOUT PELVIS) 2-3V RIGHT

[hip ap]
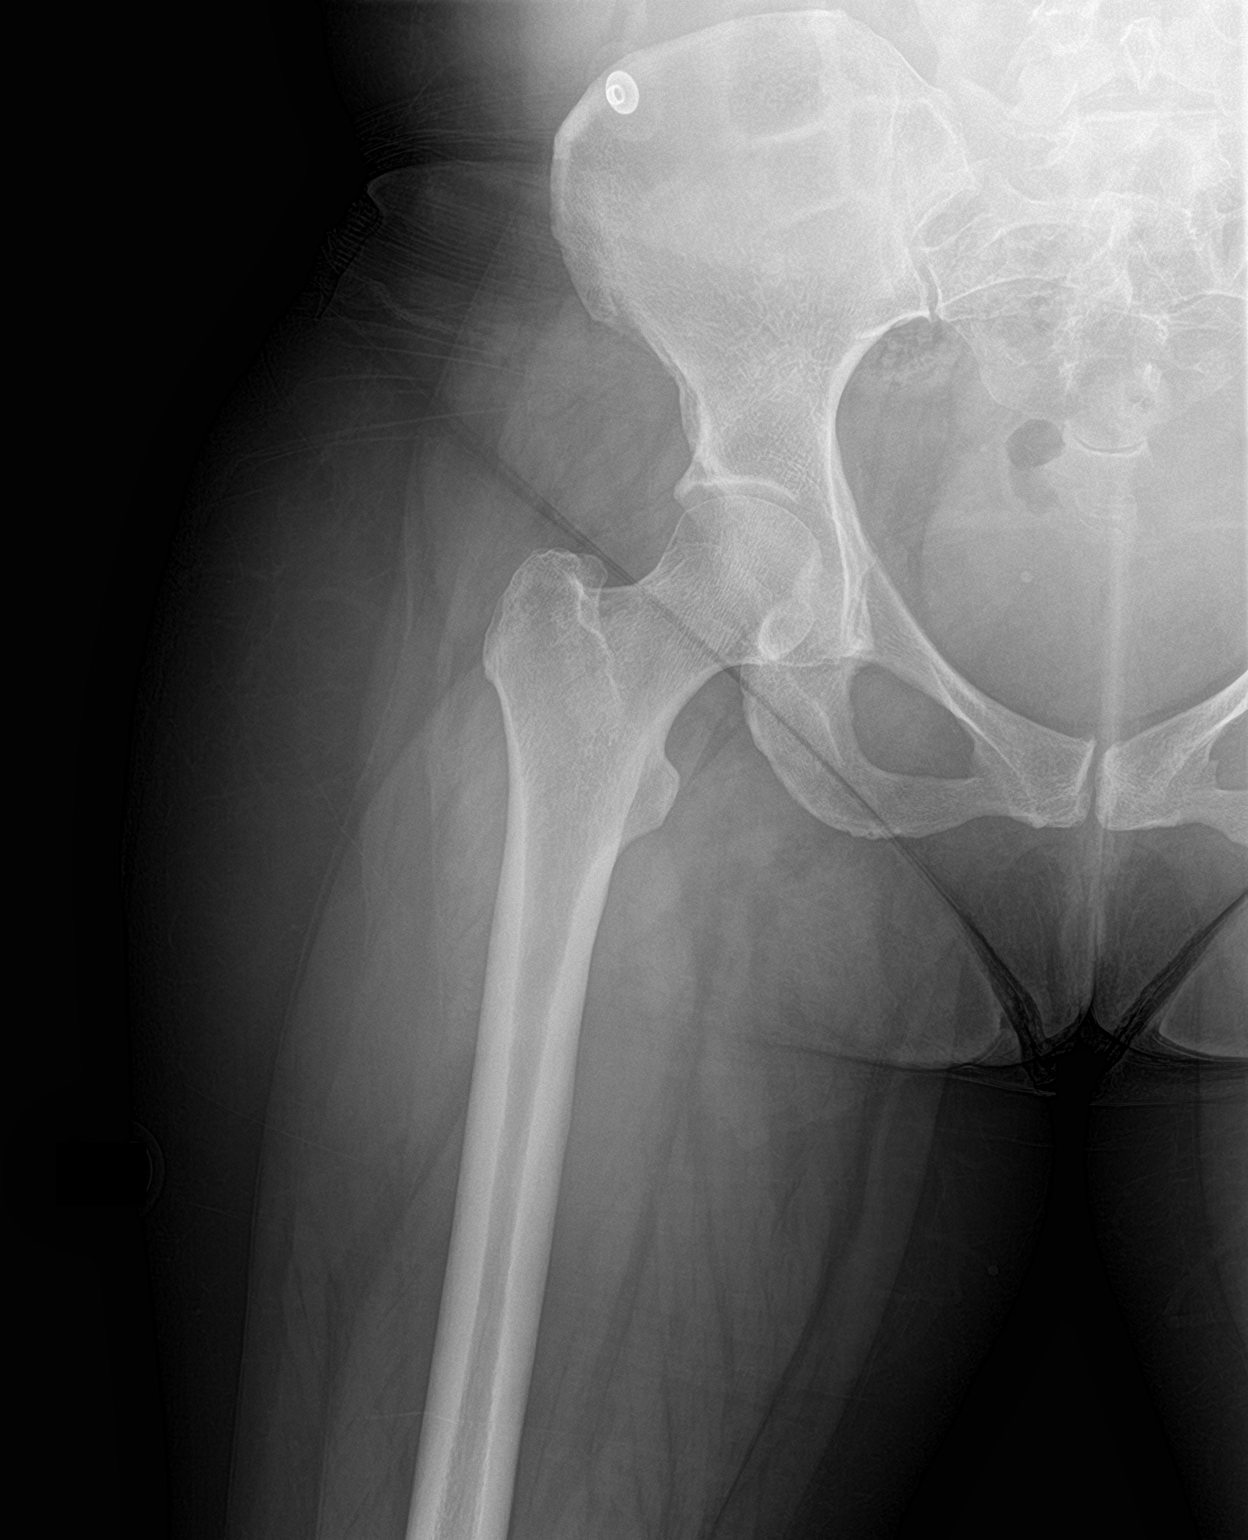

[pelvis ap]
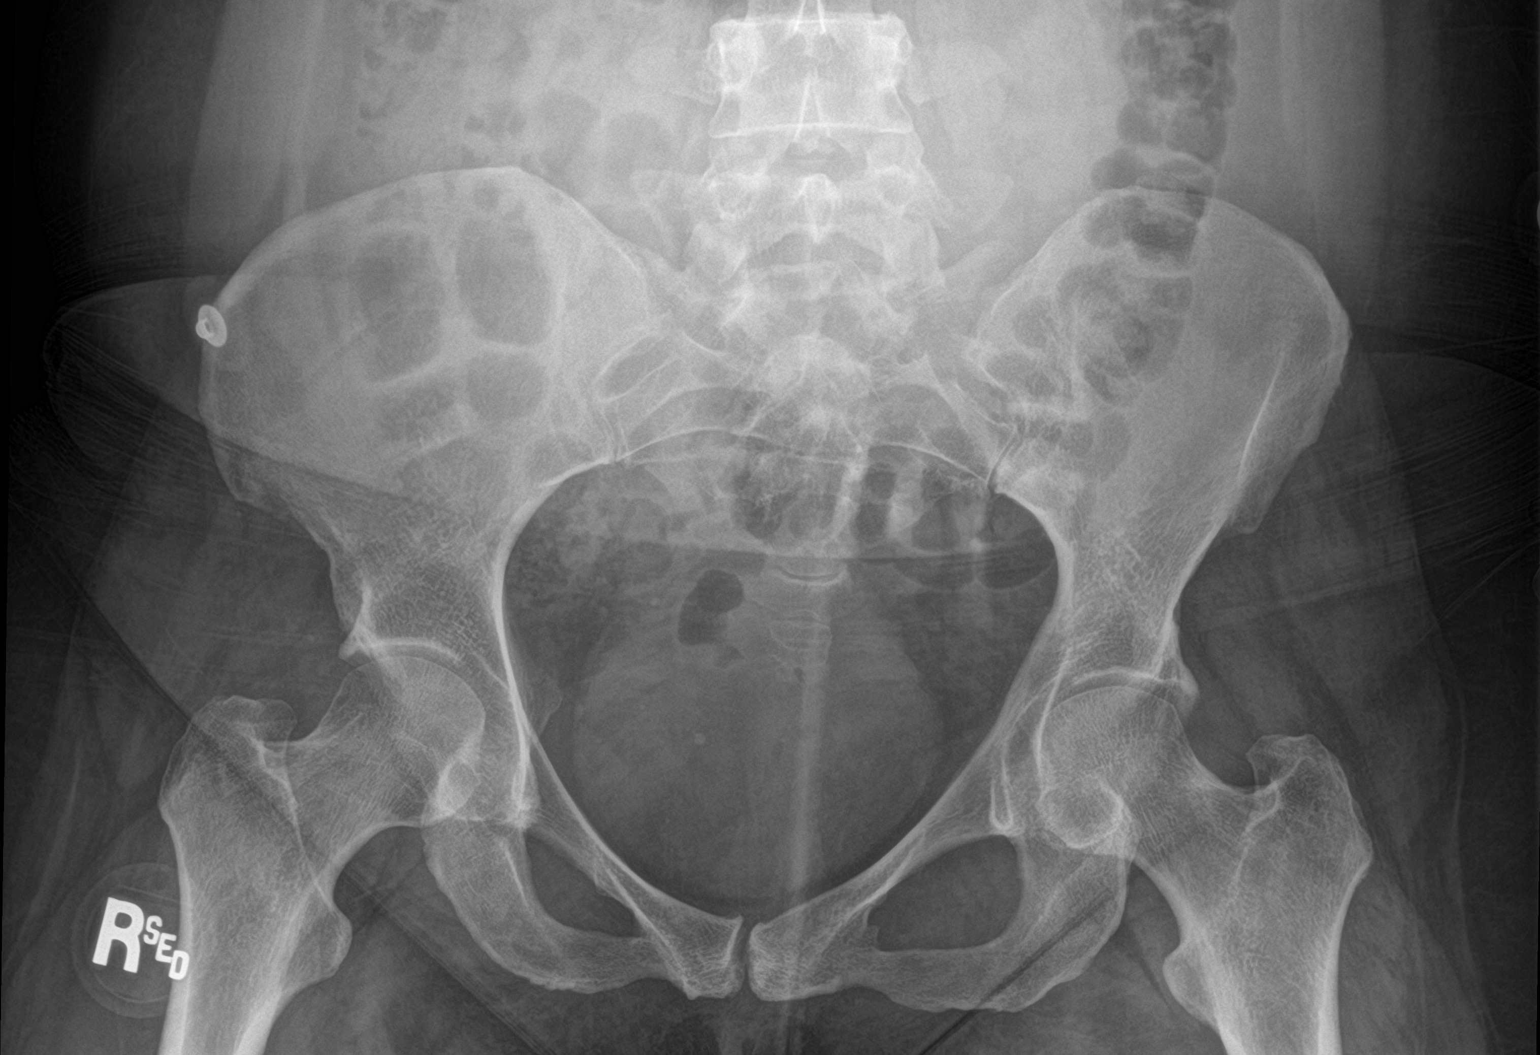

[hip lat]
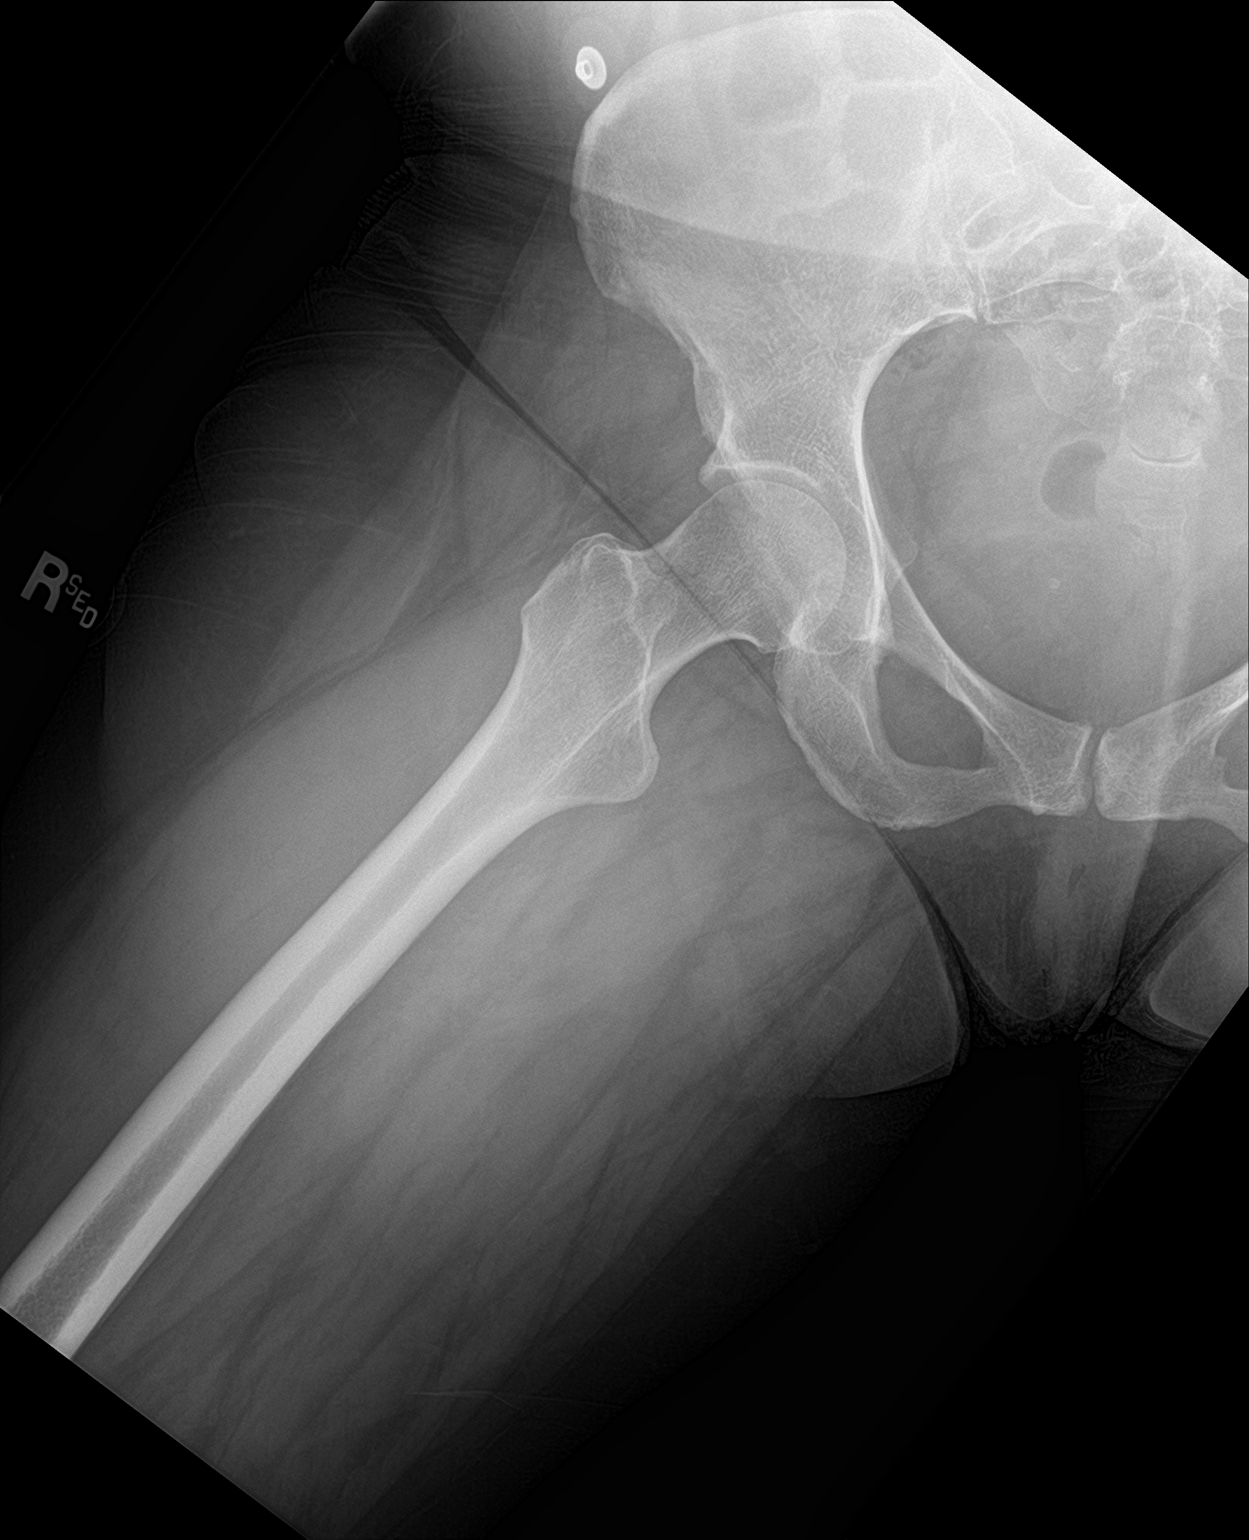

[3 of 3 positions shown; findings below may reference images not displayed]

FINDINGS: Osseous mineralization normal.

Joint spaces preserved.

No fracture, dislocation, or bone destruction.
IMPRESSION: Normal exam.

## 2024-11-26 ENCOUNTER — Ambulatory Visit
Admission: EM | Admit: 2024-11-26 | Discharge: 2024-11-26 | Disposition: A | Discharge status: Home / Self Care | Payer: Self-pay

## 2024-11-26 ENCOUNTER — Encounter: Payer: Self-pay | Admitting: Emergency Medicine

## 2024-11-26 DIAGNOSIS — J069 Acute upper respiratory infection, unspecified: Secondary | ICD-10-CM

## 2024-11-26 DIAGNOSIS — H66003 Acute suppurative otitis media without spontaneous rupture of ear drum, bilateral: Secondary | ICD-10-CM

## 2024-11-26 DIAGNOSIS — J029 Acute pharyngitis, unspecified: Secondary | ICD-10-CM

## 2024-11-26 MED ORDER — AMOXICILLIN-POT CLAVULANATE 875-125 MG PO TABS: 1.0000 | ORAL_TABLET | Freq: Two times a day (BID) | ORAL | 0 refills | Status: AC

## 2024-11-26 NOTE — ED Triage Notes (Signed)
 Pt's symptoms started 1 week ago with bodyaches. She developed a sore throat and bilateral ear pain a few days later. She has taken sudafed for her symptoms.

## 2024-11-26 NOTE — Discharge Instructions (Addendum)
 Take the Augmentin  twice daily for 10 days with food for treatment of your ear infection.  Take an over-the-counter probiotic 1 hour after each dose of antibiotic to prevent diarrhea.  Use over-the-counter Tylenol  and ibuprofen  as needed for pain or fever.  Place a hot water bottle, or heating pad, underneath your pillowcase at night to help dilate up your ear and aid in pain relief as well as resolution of the infection.  Gargle with warm salt water 2-3 times a day to soothe your throat, aid in pain relief, and aid in healing.  Take over-the-counter Tylenol  and/or ibuprofen  according to the package instructions as needed for pain.  You can also use Chloraseptic or Sucrets lozenges, 1 lozenge every 2 hours as needed for throat pain.  If you develop any new or worsening symptoms return for reevaluation.

## 2024-11-26 NOTE — ED Provider Notes (Signed)
 [facility]    CSN: [id number] Arrival date & time: 11/26/24  1513      History   Chief Complaint Chief Complaint  Patient presents with   Generalized Body Aches   Sore Throat   Otalgia    HPI Mary Andersen is a 38 y.o. female.   HPI  38 year old female with past medical history significant for depression, vertigo, attention and concentration deficit, bipolar 1 disorder presents for evaluation of bodyaches that began about a week ago that then morphed into a sore throat and bilateral ear pain that started 2 days ago.  She reports that she did have some nasal congestion at the outset but that has resolved.  No fever or cough.  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 No pertinent surgical history.  OB History     Gravida  1   Para  1   Term  1   Preterm      AB      Living  1      SAB      IAB      Ectopic      Multiple      Live Births  1            Home Medications    Prior to Admission medications  Medication Sig Start Date End Date Taking? Authorizing Provider  amoxicillin -clavulanate (AUGMENTIN ) 875-125 MG tablet Take 1 tablet by mouth every 12 (twelve) hours for 10 days. 11/26/24 12/06/24 Yes Bernardino Ditch, NP  norethindrone -ethinyl estradiol (LOESTRIN 1/20, 21,) 1-20 MG-MCG tablet Take 1 tablet by mouth daily. 01/02/18  Yes Aron, Talia M, MD  divalproex (DEPAKOTE) 500 MG DR tablet Take 500 mg by  mouth 2 (two) times daily. 12/08/16   [provider]  ibuprofen  (ADVIL ) 600 MG tablet Take 1 tablet (600 mg total) by mouth every 8 (eight) hours as needed. 05/15/20   Claudene Tanda POUR, PA-C  venlafaxine XR (EFFEXOR-XR) 150 MG 24 hr capsule Take 300 mg by mouth every morning.    [provider]    Family History Family History  Adopted: Yes    Social History Social History[1]   Allergies   Patient has no known allergies.   Review of Systems Review of Systems  Constitutional:  Negative for fever.  HENT:  Positive for ear pain and sore throat. Negative for congestion.   Respiratory:  Negative for cough.      Physical Exam Triage Vital Signs ED Triage Vitals [11/26/24 1536]  Encounter Vitals Group     BP      Girls Systolic BP Percentile      Girls Diastolic BP Percentile      Boys Systolic BP Percentile      Boys Diastolic BP Percentile      Pulse      Resp      Temp      Temp src      SpO2  Weight 169 lb (76.7 kg)     Height      Head Circumference      Peak Flow      Pain Score 6     Pain Loc      Pain Education      Exclude from Growth Chart    No data found.  Updated Vital Signs BP (!) 152/96 (BP Location: Left Arm)   Pulse 83   Temp 98.5 F (36.9 C) (Oral)   Resp 18   Wt 169 lb (76.7 kg)   LMP 11/05/2024   SpO2 95%   BMI 31.93 kg/m   Visual Acuity Right Eye Distance:   Left Eye Distance:   Bilateral Distance:    Right Eye Near:   Left Eye Near:    Bilateral Near:     Physical Exam Vitals and nursing note reviewed.  Constitutional:      Appearance: Normal appearance. She is not ill-appearing.  HENT:     Head: Normocephalic and atraumatic.     Right Ear: Ear canal and external ear normal. There is no impacted cerumen.     Left Ear: Ear canal and external ear normal. There is no impacted cerumen.     Ears:     Comments: Bilateral tympanic membranes are erythematous and injected.  Both EACs have mild amounts of  cerumen.    Mouth/Throat:     Mouth: Mucous membranes are moist.     Pharynx: Oropharynx is clear. Posterior oropharyngeal erythema present. No oropharyngeal exudate.     Comments: Tonsillar pillars are edematous and erythematous but free of exudate. Neck:     Comments: Bilateral anterior, nontender, cervical lymphadenopathy. Musculoskeletal:     Cervical back: Normal range of motion and neck supple. No tenderness.  Lymphadenopathy:     Cervical: Cervical adenopathy present.  Skin:    General: Skin is warm and dry.     Capillary Refill: Capillary refill takes less than 2 seconds.     Findings: No erythema or rash.  Neurological:     General: No focal deficit present.     Mental Status: She is alert and oriented to person, place, and time.      UC Treatments / Results  Labs (all labs ordered are listed, but only abnormal results are displayed) Labs Reviewed - No data to display  EKG   Radiology No results found.  Procedures Procedures (including critical care time)  Medications Ordered in UC Medications - No data to display  Initial Impression / Assessment and Plan / UC Course  I have reviewed the triage vital signs and the nursing notes.  Pertinent labs & imaging results that were available during my care of the patient were reviewed by me and considered in my medical decision making (see chart for details).   Patient is very pleasant, nontoxic-appearing 38 year old female presenting for evaluation of 1 week worth of respiratory symptoms as outlined in HPI above.  Although patient symptoms are in her upper respiratory tract and her 2 most significant symptoms at this point her bilateral ear pain and a sore throat.  On exam, her bilateral tympanic membranes are erythematous and injected.  Her tonsillar pillars are also edematous and erythematous without exudate.  I will treat her for otitis media with Augmentin  875 mg twice daily for 10 days.  I will not test her for strep  at this time as the Augmentin  will treat strep and I will prescribe a 10-day course which would cover potential  strep infection.  I have also advised her to use over-the-counter Chloraseptic or Sucrets lozenges and gargle with warm salt water as often as she likes to help soothe her throat.  She may also use over-the-counter Tylenol  and ibuprofen .  Return precautions reviewed.  She denies need for work note.   Final Clinical Impressions(s) / UC Diagnoses   Final diagnoses:  Upper respiratory tract infection, unspecified type  Non-recurrent acute suppurative otitis media of both ears without spontaneous rupture of tympanic membranes  Pharyngitis, unspecified etiology     Discharge Instructions      Take the Augmentin  twice daily for 10 days with food for treatment of your ear infection.  Take an over-the-counter probiotic 1 hour after each dose of antibiotic to prevent diarrhea.  Use over-the-counter Tylenol  and ibuprofen  as needed for pain or fever.  Place a hot water bottle, or heating pad, underneath your pillowcase at night to help dilate up your ear and aid in pain relief as well as resolution of the infection.  Gargle with warm salt water 2-3 times a day to soothe your throat, aid in pain relief, and aid in healing.  Take over-the-counter Tylenol  and/or ibuprofen  according to the package instructions as needed for pain.  You can also use Chloraseptic or Sucrets lozenges, 1 lozenge every 2 hours as needed for throat pain.  If you develop any new or worsening symptoms return for reevaluation.      ED Prescriptions     Medication Sig Dispense Auth. Provider   amoxicillin -clavulanate (AUGMENTIN ) 875-125 MG tablet Take 1 tablet by mouth every 12 (twelve) hours for 10 days. 20 tablet Bernardino Ditch, NP      PDMP not reviewed this encounter.    [1]  Social History Tobacco Use   Smoking status: Never   Smokeless tobacco: Never  Vaping Use   Vaping status: Never Used   Substance Use Topics   Alcohol use: Yes    Alcohol/week: 0.0 standard drinks of alcohol   Drug use: No     Bernardino Ditch, NP 11/26/24 1600
